# Patient Record
Sex: Female | Born: 1960
Health system: Southern US, Community
[De-identification: ages and names within clinical notes are randomized; demographics above are authoritative.]

## PROBLEM LIST (undated history)

## (undated) DIAGNOSIS — K589 Irritable bowel syndrome without diarrhea: Secondary | ICD-10-CM

## (undated) DIAGNOSIS — E039 Hypothyroidism, unspecified: Secondary | ICD-10-CM

## (undated) DIAGNOSIS — J45909 Unspecified asthma, uncomplicated: Secondary | ICD-10-CM

## (undated) DIAGNOSIS — K219 Gastro-esophageal reflux disease without esophagitis: Secondary | ICD-10-CM

## (undated) DIAGNOSIS — R51 Headache: Secondary | ICD-10-CM

## (undated) DIAGNOSIS — M109 Gout, unspecified: Secondary | ICD-10-CM

## (undated) HISTORY — DX: Gout, unspecified: M10.9

## (undated) HISTORY — DX: Headache: R51

## (undated) HISTORY — PX: NASAL SINUS SURGERY: SHX719

## (undated) HISTORY — PX: ABDOMINAL HYSTERECTOMY: SHX81

## (undated) HISTORY — PX: STRABISMUS SURGERY: SHX218

## (undated) HISTORY — PX: BUNIONECTOMY: SHX129

## (undated) HISTORY — DX: Hypothyroidism, unspecified: E03.9

## (undated) HISTORY — PX: TENDON REPAIR: SHX5111

## (undated) HISTORY — DX: Unspecified asthma, uncomplicated: J45.909

## (undated) HISTORY — DX: Gastro-esophageal reflux disease without esophagitis: K21.9

## (undated) HISTORY — DX: Irritable bowel syndrome, unspecified: K58.9

---
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
## 2003-09-11 DIAGNOSIS — J301 Allergic rhinitis due to pollen: Secondary | ICD-10-CM | POA: Insufficient documentation

## 2003-09-11 DIAGNOSIS — J45909 Unspecified asthma, uncomplicated: Secondary | ICD-10-CM | POA: Insufficient documentation

## 2003-10-15 DIAGNOSIS — E039 Hypothyroidism, unspecified: Secondary | ICD-10-CM | POA: Insufficient documentation

## 2005-04-20 DIAGNOSIS — M545 Low back pain, unspecified: Secondary | ICD-10-CM | POA: Insufficient documentation

## 2009-01-08 DIAGNOSIS — F419 Anxiety disorder, unspecified: Secondary | ICD-10-CM | POA: Insufficient documentation

## 2010-01-20 DIAGNOSIS — R42 Dizziness and giddiness: Secondary | ICD-10-CM | POA: Insufficient documentation

## 2011-10-06 ENCOUNTER — Ambulatory Visit: Payer: Self-pay | Admitting: Internal Medicine

## 2012-08-09 ENCOUNTER — Other Ambulatory Visit (HOSPITAL_COMMUNITY): Payer: Self-pay | Admitting: Obstetrics

## 2012-08-09 DIAGNOSIS — Z1231 Encounter for screening mammogram for malignant neoplasm of breast: Secondary | ICD-10-CM

## 2012-08-15 ENCOUNTER — Ambulatory Visit (HOSPITAL_COMMUNITY)
Admission: RE | Admit: 2012-08-15 | Discharge: 2012-08-15 | Disposition: A | Discharge status: Home / Self Care | Payer: No Typology Code available for payment source | Source: Ambulatory Visit | Attending: Obstetrics | Admitting: Obstetrics

## 2012-08-15 DIAGNOSIS — Z1231 Encounter for screening mammogram for malignant neoplasm of breast: Secondary | ICD-10-CM | POA: Insufficient documentation

## 2013-02-13 ENCOUNTER — Other Ambulatory Visit: Payer: Self-pay | Admitting: Family Medicine

## 2013-02-13 DIAGNOSIS — R51 Headache: Secondary | ICD-10-CM

## 2013-02-13 DIAGNOSIS — J329 Chronic sinusitis, unspecified: Secondary | ICD-10-CM

## 2013-02-27 ENCOUNTER — Ambulatory Visit
Admission: RE | Admit: 2013-02-27 | Discharge: 2013-02-27 | Disposition: A | Discharge status: Home / Self Care | Payer: No Typology Code available for payment source | Source: Ambulatory Visit | Attending: Family Medicine | Admitting: Family Medicine

## 2013-02-27 DIAGNOSIS — R51 Headache: Secondary | ICD-10-CM

## 2013-03-02 ENCOUNTER — Other Ambulatory Visit: Payer: No Typology Code available for payment source

## 2013-03-14 ENCOUNTER — Ambulatory Visit: Payer: No Typology Code available for payment source | Admitting: Neurology

## 2013-03-15 ENCOUNTER — Encounter: Payer: Self-pay | Admitting: Neurology

## 2013-03-15 ENCOUNTER — Ambulatory Visit (INDEPENDENT_AMBULATORY_CARE_PROVIDER_SITE_OTHER): Payer: No Typology Code available for payment source | Admitting: Neurology

## 2013-03-15 ENCOUNTER — Encounter (INDEPENDENT_AMBULATORY_CARE_PROVIDER_SITE_OTHER): Payer: Self-pay

## 2013-03-15 VITALS — BP 131/71 | HR 71 | Ht 62.0 in | Wt 144.0 lb

## 2013-03-15 DIAGNOSIS — R279 Unspecified lack of coordination: Secondary | ICD-10-CM

## 2013-03-15 DIAGNOSIS — R519 Headache, unspecified: Secondary | ICD-10-CM | POA: Insufficient documentation

## 2013-03-15 DIAGNOSIS — R51 Headache: Secondary | ICD-10-CM

## 2013-03-15 DIAGNOSIS — R270 Ataxia, unspecified: Secondary | ICD-10-CM

## 2013-03-15 HISTORY — DX: Headache: R51

## 2013-03-15 NOTE — Patient Instructions (Signed)

## 2013-03-15 NOTE — Progress Notes (Signed)
Reason for visit: Headache  Katie Oneill is an 53 y.o. female  History of present illness:  [name] is a 53 year old right-handed white female with a history of asthma who began having headaches in November 2014. The patient noted that the headache was on the top of the head, associated with a pressure feeling that would come and go throughout the day, but the headaches were everyday for almost 6 weeks. The patient also had cognitive processing and word finding problems, and clumsiness of the right hand. With walking, the patient will have trouble with veering to the left. The patient did not have any falls. The patient reported no numbness of the extremities or face. The patient had no visual complaints, and no problems with slurred speech or problems swallowing. The patient did not have any blackout episodes. In the past, the patient has had episodes of true vertigo unassociated with headache. The patient has occasional neck pain, without radiation down the arms. The patient denies any problems controlling the bowels or the bladder. One week prior to this evaluation, the headaches have gone away, and the patient now feels essentially normal. The patient underwent MRI evaluation of the brain that was done on 02/13/2013, and this study was reviewed on line. This study was normal. The patient is sent to this office for further evaluation.   Past Medical History  Diagnosis Date  . Hypothyroid   . Asthma   . YHCWCBJS(283.1) 03/15/2013    Past Surgical History  Procedure Laterality Date  . Nasal sinus surgery    . Strabismus surgery    . Bunionectomy Left   . Abdominal hysterectomy    . Tendon repair Right     hand    Family History  Problem Relation Age of Onset  . Heart disease Mother   . Heart disease Father   . Melanoma Father     Social history:  reports that she has never smoked. She has never used smokeless tobacco. She reports that she drinks alcohol. She reports that she  does not use illicit drugs.    Allergies  Allergen Reactions  . Penicillins Hives  . Sulfa Antibiotics Hives    Medications:  No current outpatient prescriptions on file prior to visit.   No current facility-administered medications on file prior to visit.    ROS:  Out of a complete 14 system review of symptoms, the patient complains only of the following symptoms, and all other reviewed systems are negative.  Weight gain, fatigue Feeling hot Allergies Headache, weakness, dizziness Anxiety  Blood pressure 131/71, pulse 71, height 5\' 2"  (1.575 m), weight 144 lb (65.318 kg).  Physical Exam  General: The patient is alert and cooperative at the time of the examination.  Eyes: Pupils are equal, round, and reactive to light. Discs are flat bilaterally.  Neck: The neck is supple, no carotid bruits are noted.  Respiratory: The respiratory examination is clear.  Cardiovascular: The cardiovascular examination reveals a regular rate and rhythm, no obvious murmurs or rubs are noted.  Skin: Extremities are without significant edema.  Neurologic Exam  Mental status: The patient is alert and oriented x 3 at the time of the examination. The patient has apparent normal recent and remote memory, with an apparently normal attention span and concentration ability.  Cranial nerves: Facial symmetry is present. There is good sensation of the face to pinprick and soft touch bilaterally. The strength of the facial muscles and the muscles to head turning and shoulder  shrug are normal bilaterally. Speech is well enunciated, no aphasia or dysarthria is noted. Extraocular movements are full. Visual fields are full. The tongue is midline, and the patient has symmetric elevation of the soft palate. No obvious hearing deficits are noted.  Motor: The motor testing reveals 5 over 5 strength of all 4 extremities. Good symmetric motor tone is noted throughout.  Sensory: Sensory testing is intact to  pinprick, soft touch, vibration sensation, and position sense on all 4 extremities. No evidence of extinction is noted.  Coordination: Cerebellar testing reveals good finger-nose-finger and heel-to-shin bilaterally.  Gait and station: Gait is normal. Tandem gait is normal. Romberg is negative. No drift is seen.  Reflexes: Deep tendon reflexes are symmetric and normal bilaterally. Toes are downgoing bilaterally.     MRI brain  02/13/2013:   IMPRESSION:  Normal noncontrast MRI of the brain for age. No specific findings to  explain dizziness.    Assessment/Plan:  One. Headache, gait disturbance, ataxia right arm  The patient at this point is back to her baseline. MRI of the brain was complely normal, but the patient had symptoms of headache, right arm clumsiness, and a gait disturbance that lasted almost 6 weeks. The patient may have migraine, but the patient will undergo further evaluation for cerebrovascular disease. The patient has no prior history of migraine. The patient will be set up for a carotid Doppler study, and a MRA of the head. The patient will undergo blood work today. If the headache and focal symptoms recur, the patient will be placed on medications for presumed migraine headache. Low-dose aspirin should be considered.   Jill Alexanders MD 03/15/2013 3:13 PM  [facility] [address] [place], [place] [address]  Phone [phone] Fax [phone]

## 2013-03-16 LAB — SEDIMENTATION RATE: Sed Rate: 3 mm/hr (ref 0–40)

## 2013-03-16 LAB — ANGIOTENSIN CONVERTING ENZYME: Angio Convert Enzyme: 47 U/L (ref 14–82)

## 2013-03-16 LAB — ANA W/REFLEX: ANA: NEGATIVE

## 2013-03-22 ENCOUNTER — Telehealth: Payer: Self-pay | Admitting: Neurology

## 2013-03-22 NOTE — Telephone Encounter (Signed)
Called patient and informed that order is still processing, awaiting approval from [employer]),lt Message

## 2013-03-22 NOTE — Telephone Encounter (Signed)
Patient needing to schedule MRI but has not heard back from anyone. Please call to advise.

## 2013-03-28 ENCOUNTER — Ambulatory Visit (INDEPENDENT_AMBULATORY_CARE_PROVIDER_SITE_OTHER): Payer: No Typology Code available for payment source

## 2013-03-28 DIAGNOSIS — R270 Ataxia, unspecified: Secondary | ICD-10-CM

## 2013-03-28 DIAGNOSIS — R279 Unspecified lack of coordination: Secondary | ICD-10-CM

## 2013-03-28 DIAGNOSIS — R51 Headache: Secondary | ICD-10-CM

## 2013-03-28 DIAGNOSIS — R269 Unspecified abnormalities of gait and mobility: Secondary | ICD-10-CM

## 2013-03-29 ENCOUNTER — Telehealth: Payer: Self-pay | Admitting: Neurology

## 2013-03-29 NOTE — Telephone Encounter (Signed)
Called patient and read Dr Tobey Grim message back to patient, she verbalized understanding

## 2013-03-29 NOTE — Telephone Encounter (Signed)
Patient could not understand Dr. Jannifer Franklin message concerning aspirin. Please call back to advise.

## 2013-03-29 NOTE — Telephone Encounter (Signed)
I called patient. The carotid Doppler study was unremarkable. MRA of the head showed some artifact in the left internal carotid artery, but no definite blockages. The patient should be on low-dose aspirin, the event she is having could represent migrainous episodes.   MRI angiogram of the head 03/29/2013:  Impression   Equivocal MRA head (without) demonstrating: 1. The left internal carotid artery has a focal area of decreased signal  as it enters the petrous ridge. There is normal appearing flow more  distally through the remainder of the internal carotid artery. Therefore  this likely represents artifact, and less likely atherosclerotic stenosis. 2. Remainder of medium-large sized vessels are unremarkable.

## 2013-06-18 ENCOUNTER — Inpatient Hospital Stay (HOSPITAL_COMMUNITY)
Admission: EM | Admit: 2013-06-18 | Discharge: 2013-06-21 | DRG: 419 | Disposition: A | Discharge status: Home / Self Care | Payer: No Typology Code available for payment source | Attending: Internal Medicine | Admitting: Internal Medicine

## 2013-06-18 ENCOUNTER — Encounter (HOSPITAL_COMMUNITY): Payer: Self-pay | Admitting: Emergency Medicine

## 2013-06-18 DIAGNOSIS — K8070 Calculus of gallbladder and bile duct without cholecystitis without obstruction: Principal | ICD-10-CM | POA: Diagnosis present

## 2013-06-18 DIAGNOSIS — J45909 Unspecified asthma, uncomplicated: Secondary | ICD-10-CM | POA: Diagnosis present

## 2013-06-18 DIAGNOSIS — R52 Pain, unspecified: Secondary | ICD-10-CM | POA: Diagnosis not present

## 2013-06-18 DIAGNOSIS — R51 Headache: Secondary | ICD-10-CM | POA: Diagnosis present

## 2013-06-18 DIAGNOSIS — Z808 Family history of malignant neoplasm of other organs or systems: Secondary | ICD-10-CM

## 2013-06-18 DIAGNOSIS — K8050 Calculus of bile duct without cholangitis or cholecystitis without obstruction: Secondary | ICD-10-CM | POA: Diagnosis present

## 2013-06-18 DIAGNOSIS — E039 Hypothyroidism, unspecified: Secondary | ICD-10-CM | POA: Diagnosis present

## 2013-06-18 DIAGNOSIS — Z880 Allergy status to penicillin: Secondary | ICD-10-CM

## 2013-06-18 DIAGNOSIS — Z8249 Family history of ischemic heart disease and other diseases of the circulatory system: Secondary | ICD-10-CM

## 2013-06-18 DIAGNOSIS — Z881 Allergy status to other antibiotic agents status: Secondary | ICD-10-CM

## 2013-06-18 DIAGNOSIS — R109 Unspecified abdominal pain: Secondary | ICD-10-CM | POA: Diagnosis not present

## 2013-06-18 DIAGNOSIS — D72829 Elevated white blood cell count, unspecified: Secondary | ICD-10-CM | POA: Diagnosis present

## 2013-06-18 DIAGNOSIS — Z79899 Other long term (current) drug therapy: Secondary | ICD-10-CM

## 2013-06-18 NOTE — ED Notes (Signed)
Pt reports she had sudden onset mid abdominal pain radiating to her back after eating and going to yoga. States that she has had mild pain that she takes Zantac before, but not this severe. Pt reports pain has decreased at this time. Denies n/v/d. LBM 4/27. Pt a&o x4, ambulatory to triage.

## 2013-06-19 ENCOUNTER — Inpatient Hospital Stay (HOSPITAL_COMMUNITY): Payer: No Typology Code available for payment source

## 2013-06-19 ENCOUNTER — Encounter (HOSPITAL_COMMUNITY): Payer: Self-pay | Admitting: *Deleted

## 2013-06-19 ENCOUNTER — Emergency Department (HOSPITAL_COMMUNITY): Payer: No Typology Code available for payment source

## 2013-06-19 ENCOUNTER — Encounter (HOSPITAL_COMMUNITY)
Admission: EM | Disposition: A | Discharge status: Home / Self Care | Payer: Self-pay | Source: Home / Self Care | Attending: Internal Medicine

## 2013-06-19 DIAGNOSIS — R109 Unspecified abdominal pain: Secondary | ICD-10-CM | POA: Diagnosis present

## 2013-06-19 DIAGNOSIS — K8050 Calculus of bile duct without cholangitis or cholecystitis without obstruction: Secondary | ICD-10-CM | POA: Diagnosis present

## 2013-06-19 HISTORY — PX: ERCP: SHX5425

## 2013-06-19 LAB — URINALYSIS, ROUTINE W REFLEX MICROSCOPIC
Bilirubin Urine: NEGATIVE
Glucose, UA: NEGATIVE mg/dL
Hgb urine dipstick: NEGATIVE
Ketones, ur: NEGATIVE mg/dL
Leukocytes, UA: NEGATIVE
NITRITE: NEGATIVE
PH: 8 (ref 5.0–8.0)
Protein, ur: NEGATIVE mg/dL
SPECIFIC GRAVITY, URINE: 1.004 — AB (ref 1.005–1.030)
Urobilinogen, UA: 0.2 mg/dL (ref 0.0–1.0)

## 2013-06-19 LAB — I-STAT TROPONIN, ED: Troponin i, poc: 0 ng/mL (ref 0.00–0.08)

## 2013-06-19 LAB — COMPREHENSIVE METABOLIC PANEL
ALBUMIN: 3.6 g/dL (ref 3.5–5.2)
ALK PHOS: 62 U/L (ref 39–117)
ALT: 20 U/L (ref 0–35)
AST: 23 U/L (ref 0–37)
BUN: 15 mg/dL (ref 6–23)
CO2: 26 mEq/L (ref 19–32)
CREATININE: 0.73 mg/dL (ref 0.50–1.10)
Calcium: 9.4 mg/dL (ref 8.4–10.5)
Chloride: 101 mEq/L (ref 96–112)
GFR calc Af Amer: >90 mL/min (ref >90)
GFR calc non Af Amer: >90 mL/min (ref >90)
Glucose, Bld: 116 mg/dL — ABNORMAL HIGH (ref 70–99)
POTASSIUM: 3.9 meq/L (ref 3.7–5.3)
Sodium: 139 mEq/L (ref 137–147)
TOTAL PROTEIN: 6.6 g/dL (ref 6.0–8.3)
Total Bilirubin: 0.2 mg/dL — ABNORMAL LOW (ref 0.3–1.2)

## 2013-06-19 LAB — LIPASE, BLOOD: LIPASE: 42 U/L (ref 11–59)

## 2013-06-19 LAB — CBC WITH DIFFERENTIAL/PLATELET
BASOS ABS: 0 10*3/uL (ref 0.0–0.1)
BASOS PCT: 0 % (ref 0–1)
EOS ABS: 0.1 10*3/uL (ref 0.0–0.7)
Eosinophils Relative: 1 % (ref 0–5)
HCT: 39.9 % (ref 36.0–46.0)
HEMOGLOBIN: 13.4 g/dL (ref 12.0–15.0)
Lymphocytes Relative: 21 % (ref 12–46)
Lymphs Abs: 2.4 10*3/uL (ref 0.7–4.0)
MCH: 28.9 pg (ref 26.0–34.0)
MCHC: 33.6 g/dL (ref 30.0–36.0)
MCV: 86 fL (ref 78.0–100.0)
MONOS PCT: 7 % (ref 3–12)
Monocytes Absolute: 0.8 10*3/uL (ref 0.1–1.0)
Neutro Abs: 8.1 10*3/uL — ABNORMAL HIGH (ref 1.7–7.7)
Neutrophils Relative %: 71 % (ref 43–77)
Platelets: 279 10*3/uL (ref 150–400)
RBC: 4.64 MIL/uL (ref 3.87–5.11)
RDW: 12.3 % (ref 11.5–15.5)
WBC: 11.4 10*3/uL — ABNORMAL HIGH (ref 4.0–10.5)

## 2013-06-19 SURGERY — ERCP, WITH INTERVENTION IF INDICATED
Anesthesia: Moderate Sedation

## 2013-06-19 MED ORDER — LEVALBUTEROL TARTRATE 45 MCG/ACT IN AERO: 2.0000 | INHALATION_SPRAY | RESPIRATORY_TRACT | Status: DC | PRN

## 2013-06-19 MED ORDER — METRONIDAZOLE IN NACL 5-0.79 MG/ML-% IV SOLN
500.0000 mg | Freq: Once | INTRAVENOUS | Status: DC
  Filled 2013-06-19: qty 100

## 2013-06-19 MED ORDER — FENTANYL CITRATE 0.05 MG/ML IJ SOLN: 50.0000 ug | INTRAMUSCULAR | Status: DC | PRN

## 2013-06-19 MED ORDER — FAMOTIDINE 20 MG PO TABS
20.0000 mg | ORAL_TABLET | Freq: Two times a day (BID) | ORAL | Status: DC
  Administered 2013-06-19 – 2013-06-21 (×3): 20 mg via ORAL
  Filled 2013-06-19 (×6): qty 1

## 2013-06-19 MED ORDER — OMEGA-3-ACID ETHYL ESTERS 1 G PO CAPS
1.0000 g | ORAL_CAPSULE | Freq: Every day | ORAL | Status: DC
  Filled 2013-06-19 (×3): qty 1

## 2013-06-19 MED ORDER — SODIUM CHLORIDE 0.9 % IV SOLN
INTRAVENOUS | Status: AC
  Administered 2013-06-19: 14:00:00 via INTRAVENOUS

## 2013-06-19 MED ORDER — PIPERACILLIN-TAZOBACTAM 3.375 G IVPB: 3.3750 g | Freq: Once | INTRAVENOUS | Status: DC

## 2013-06-19 MED ORDER — MIDAZOLAM HCL 10 MG/2ML IJ SOLN
INTRAMUSCULAR | Status: DC | PRN
  Administered 2013-06-19: 2 mg via INTRAVENOUS
  Administered 2013-06-19: 1 mg via INTRAVENOUS
  Administered 2013-06-19: 2 mg via INTRAVENOUS
  Administered 2013-06-19: 2.5 mg via INTRAVENOUS
  Administered 2013-06-19 (×2): 1 mg via INTRAVENOUS

## 2013-06-19 MED ORDER — LEVOTHYROXINE SODIUM 88 MCG PO TABS
88.0000 ug | ORAL_TABLET | Freq: Every day | ORAL | Status: DC
  Administered 2013-06-19: 88 ug via ORAL
  Filled 2013-06-19 (×4): qty 1

## 2013-06-19 MED ORDER — FLUTICASONE PROPIONATE 50 MCG/ACT NA SUSP
2.0000 | Freq: Every day | NASAL | Status: DC
  Filled 2013-06-19: qty 16

## 2013-06-19 MED ORDER — CIPROFLOXACIN IN D5W 400 MG/200ML IV SOLN
400.0000 mg | Freq: Two times a day (BID) | INTRAVENOUS | Status: DC
  Administered 2013-06-19 – 2013-06-20 (×3): 400 mg via INTRAVENOUS
  Filled 2013-06-19 (×3): qty 200

## 2013-06-19 MED ORDER — LORAZEPAM 0.5 MG PO TABS: 0.5000 mg | ORAL_TABLET | ORAL | Status: DC | PRN

## 2013-06-19 MED ORDER — FENTANYL CITRATE 0.05 MG/ML IJ SOLN
INTRAMUSCULAR | Status: AC
  Filled 2013-06-19: qty 4

## 2013-06-19 MED ORDER — DIPHENHYDRAMINE HCL 50 MG/ML IJ SOLN
INTRAMUSCULAR | Status: AC
  Filled 2013-06-19: qty 1

## 2013-06-19 MED ORDER — LORATADINE 10 MG PO TABS
10.0000 mg | ORAL_TABLET | Freq: Every day | ORAL | Status: DC
  Administered 2013-06-21: 10 mg via ORAL
  Filled 2013-06-19 (×3): qty 1

## 2013-06-19 MED ORDER — BUDESONIDE-FORMOTEROL FUMARATE 80-4.5 MCG/ACT IN AERO
2.0000 | INHALATION_SPRAY | Freq: Two times a day (BID) | RESPIRATORY_TRACT | Status: DC
  Administered 2013-06-19: 2 via RESPIRATORY_TRACT
  Filled 2013-06-19: qty 6.9

## 2013-06-19 MED ORDER — ONDANSETRON HCL 4 MG/2ML IJ SOLN: 4.0000 mg | Freq: Three times a day (TID) | INTRAMUSCULAR | Status: AC | PRN

## 2013-06-19 MED ORDER — SODIUM CHLORIDE 0.9 % IV SOLN
INTRAVENOUS | Status: DC
Start: 2013-06-19 — End: 2013-06-19
  Administered 2013-06-19: 500 mL via INTRAVENOUS

## 2013-06-19 MED ORDER — FENTANYL CITRATE 0.05 MG/ML IJ SOLN
INTRAMUSCULAR | Status: DC | PRN
  Administered 2013-06-19 (×4): 25 ug via INTRAVENOUS

## 2013-06-19 MED ORDER — GLUCAGON HCL (RDNA) 1 MG IJ SOLR
INTRAMUSCULAR | Status: AC
  Filled 2013-06-19: qty 2

## 2013-06-19 MED ORDER — DIPHENHYDRAMINE HCL 50 MG/ML IJ SOLN
INTRAMUSCULAR | Status: DC | PRN
  Administered 2013-06-19: 50 mg via INTRAVENOUS

## 2013-06-19 MED ORDER — ONDANSETRON HCL 4 MG PO TABS: 4.0000 mg | ORAL_TABLET | Freq: Four times a day (QID) | ORAL | Status: DC | PRN

## 2013-06-19 MED ORDER — METRONIDAZOLE IN NACL 5-0.79 MG/ML-% IV SOLN
500.0000 mg | Freq: Three times a day (TID) | INTRAVENOUS | Status: DC
  Administered 2013-06-19 – 2013-06-20 (×4): 500 mg via INTRAVENOUS
  Filled 2013-06-19 (×5): qty 100

## 2013-06-19 MED ORDER — FENTANYL CITRATE 0.05 MG/ML IJ SOLN
50.0000 ug | Freq: Once | INTRAMUSCULAR | Status: AC
  Administered 2013-06-19: 50 ug via INTRAVENOUS
  Filled 2013-06-19: qty 2

## 2013-06-19 MED ORDER — FENTANYL CITRATE 0.05 MG/ML IJ SOLN
50.0000 ug | INTRAMUSCULAR | Status: DC | PRN
  Administered 2013-06-19 – 2013-06-20 (×6): 50 ug via INTRAVENOUS
  Filled 2013-06-19 (×6): qty 2

## 2013-06-19 MED ORDER — ONDANSETRON HCL 4 MG/2ML IJ SOLN: 4.0000 mg | Freq: Four times a day (QID) | INTRAMUSCULAR | Status: DC | PRN

## 2013-06-19 MED ORDER — MIDAZOLAM HCL 10 MG/2ML IJ SOLN
INTRAMUSCULAR | Status: AC
  Filled 2013-06-19: qty 4

## 2013-06-19 MED ORDER — ALBUTEROL SULFATE (2.5 MG/3ML) 0.083% IN NEBU: 2.5000 mg | INHALATION_SOLUTION | Freq: Four times a day (QID) | RESPIRATORY_TRACT | Status: DC | PRN

## 2013-06-19 MED ORDER — SODIUM CHLORIDE 0.9 % IV SOLN
INTRAVENOUS | Status: DC | PRN
  Administered 2013-06-19: 18:00:00

## 2013-06-19 MED ORDER — CIPROFLOXACIN IN D5W 400 MG/200ML IV SOLN
400.0000 mg | Freq: Once | INTRAVENOUS | Status: AC
  Administered 2013-06-19: 400 mg via INTRAVENOUS
  Filled 2013-06-19: qty 200

## 2013-06-19 NOTE — ED Notes (Signed)
Dr. Devine at bedside

## 2013-06-19 NOTE — Op Note (Signed)
[facility] [place], [address]   ERCP PROCEDURE REPORT  PATIENT: Katie Oneill, Katie Oneill  MR# :[id number] BIRTHDATE: 1960-07-15  GENDER: Female ENDOSCOPIST: Carol Ada, MD REFERRED BY: PROCEDURE DATE:  06/19/2013 PROCEDURE:   ERCP with stone removal ASA CLASS:   Class II INDICATIONS: Choledocholithiasis MEDICATIONS: Versed 10 mg IV, Fentanyl 100 mcg IV, Cipro 400 mg IV, and Benadryl 50 mg IV TOPICAL ANESTHETIC: Cetacaine Spray  DESCRIPTION OF PROCEDURE:   After the risks benefits and alternatives of the procedure were thoroughly explained, informed consent was obtained.  The duodenoscope [id number]  endoscope was introduced through the mouth  and advanced to the second portion of the duodenum.  The ampulla was large and there was some friability at the distal tip.  ? spontaneous passage of the gallstone. Cannulation was moderately difficult with the floppy papilla. After several attempts the guidewire was secured in the PD.  A secondary wire was utilized and the easy access to the CBD was achieved.  The guidewire was secured in the left intrahepatic ducts.  Contrast injection revealed a CBD measuring 7-8 mm, but no evidence of any CBD or common hepatic duct stones.  A 1 cm sphincterotomy was created and then the CBD was swept four times. No stones were extracted.  Both the left and right common hepatic ducts were swept with the balloon.  No evidence of any cystic duct stones.     The scope was then completely withdrawn from the patient and the procedure terminated.     COMPLICATIONS:  ENDOSCOPIC IMPRESSION: 1) No further evidence of choledochoithiasis.  RECOMMENDATIONS: 1) Surgical consultation for cholecystectomy.    _______________________________ eSignedCarol Ada, MD 06/19/2013 6:06 PM   CC:

## 2013-06-19 NOTE — ED Provider Notes (Signed)
CSN: [id number]     Arrival date & time 06/18/13  2304 History   First MD Initiated Contact with Patient 06/19/13 0148     Chief Complaint  Patient presents with  . Abdominal Pain     (Consider location/radiation/quality/duration/timing/severity/associated sxs/prior Treatment) HPI Comments: Otherwise healthy 53 year old female presents with acute onset of periumbilical and right upper quadrant pain that radiated to the right shoulder. Initially this came on acutely, was severe and was associated with nausea, it has gradually eased off and now her symptoms are minimal. She denies any fevers chills coughing shortness of breath dysuria diarrhea rectal bleeding swelling or rashes. She has a history of hysterectomy but has never had any abdominal surgery or biliary colic. She reports having intermittent abdominal pain over several years which is rare and usually comes on after eating. She had a healthy egg white omelette earlier in the evening.  Patient is a 53 y.o. female presenting with abdominal pain. The history is provided by the patient and the spouse.  Abdominal Pain   Past Medical History  Diagnosis Date  . Hypothyroid   . Asthma   . NWGNFAOZ(308.6) 03/15/2013   Past Surgical History  Procedure Laterality Date  . Nasal sinus surgery      x2  . Strabismus surgery    . Bunionectomy Left   . Abdominal hysterectomy    . Tendon repair Right     hand   Family History  Problem Relation Age of Onset  . Heart disease Mother   . Heart disease Father   . Melanoma Father    History  Substance Use Topics  . Smoking status: Never Smoker   . Smokeless tobacco: Never Used  . Alcohol Use: Yes     Comment: rarely   OB History   Grav Para Term Preterm Abortions TAB SAB Ect Mult Living                 Review of Systems  Gastrointestinal: Positive for abdominal pain.  All other systems reviewed and are negative.     Allergies  Penicillins and Sulfa antibiotics  Home  Medications   Prior to Admission medications   Medication Sig Start Date End Date Taking? Authorizing Provider  Alum Hydroxide-Mag Carbonate (GAVISCON EXTRA STRENGTH PO) Take 1 tablet by mouth as needed (stomach issues).   Yes Historical Provider, MD  Calcium Carbonate-Vitamin D (CALCIUM + D PO) Take 1 tablet by mouth daily.   Yes Historical Provider, MD  Cyanocobalamin (VITAMIN B-12 PO) Take 1 capsule by mouth daily.   Yes Historical Provider, MD  fluticasone (FLONASE) 50 MCG/ACT nasal spray Place 2 sprays into both nostrils daily.  01/28/13  Yes Historical Provider, MD  levothyroxine (SYNTHROID, LEVOTHROID) 75 MCG tablet Take 75 mcg by mouth every other day. 02/23/13  Yes Historical Provider, MD  levothyroxine (SYNTHROID, LEVOTHROID) 88 MCG tablet Take 88 mcg by mouth daily before breakfast.   Yes Historical Provider, MD  loratadine (CLARITIN) 10 MG tablet Take 10 mg by mouth daily.   Yes Historical Provider, MD  LORazepam (ATIVAN) 0.5 MG tablet Take 0.5 mg by mouth as needed for anxiety (usually only uses for flight).   Yes Historical Provider, MD  Omega-3 Fatty Acids (FISH OIL PO) Take 1 capsule by mouth daily.   Yes Historical Provider, MD  ranitidine (ZANTAC) 150 MG tablet Take 150 mg by mouth as needed for heartburn (stomach pain).   Yes Historical Provider, MD  SYMBICORT 80-4.5 MCG/ACT inhaler Inhale 2 puffs into  the lungs 2 (two) times daily.  01/08/13  Yes Historical Provider, MD  XOPENEX HFA 45 MCG/ACT inhaler Inhale 2 puffs into the lungs as needed (SOB).  01/29/13  Yes Historical Provider, MD   BP 127/70  Pulse 61  Temp(Src) 98.1 F (36.7 C) (Oral)  Resp 16  Ht 5\' 2"  (1.575 m)  Wt 138 lb (62.596 kg)  BMI 25.23 kg/m2  SpO2 98% Physical Exam  Nursing note and vitals reviewed. Constitutional: She appears well-developed and well-nourished. No distress.  HENT:  Head: Normocephalic and atraumatic.  Mouth/Throat: Oropharynx is clear and moist. No oropharyngeal exudate.  Eyes:  Conjunctivae and EOM are normal. Pupils are equal, round, and reactive to light. Right eye exhibits no discharge. Left eye exhibits no discharge. No scleral icterus.  Neck: Normal range of motion. Neck supple. No JVD present. No thyromegaly present.  Cardiovascular: Normal rate, regular rhythm, normal heart sounds and intact distal pulses.  Exam reveals no gallop and no friction rub.   No murmur heard. Pulmonary/Chest: Effort normal and breath sounds normal. No respiratory distress. She has no wheezes. She has no rales.  Abdominal: Soft. Bowel sounds are normal. She exhibits no distension and no mass. There is tenderness ( Mild tenderness in the left upper quadrant and mid abdomen, no Murphy sign, no pain at McBurney's point).  Musculoskeletal: Normal range of motion. She exhibits no edema and no tenderness.  Lymphadenopathy:    She has no cervical adenopathy.  Neurological: She is alert. Coordination normal.  Skin: Skin is warm and dry. No rash noted. No erythema.  Psychiatric: She has a normal mood and affect. Her behavior is normal.    ED Course  Procedures (including critical care time) Labs Review Labs Reviewed  CBC WITH DIFFERENTIAL - Abnormal; Notable for the following:    WBC 11.4 (*)    Neutro Abs 8.1 (*)    All other components within normal limits  COMPREHENSIVE METABOLIC PANEL - Abnormal; Notable for the following:    Glucose, Bld 116 (*)    Total Bilirubin 0.2 (*)    All other components within normal limits  URINALYSIS, ROUTINE W REFLEX MICROSCOPIC - Abnormal; Notable for the following:    Specific Gravity, Urine 1.004 (*)    All other components within normal limits  LIPASE, BLOOD  I-STAT TROPOININ, ED    Imaging Review US Abdomen Complete  06/19/2013   CLINICAL DATA:  Right upper quadrant abdominal pain and leukocytosis.  EXAM: ULTRASOUND ABDOMEN COMPLETE  COMPARISON:  None.  FINDINGS: Gallbladder:  Two mobile gallstones are seen, measuring up to 5 mm in size. No  gallbladder wall thickening or pericholecystic fluid is seen. No ultrasonographic Murphy's sign is elicited.  Common bile duct:  Diameter: 0.6 cm, within normal limits in caliber. A single 0.5 cm stone is noted at the distal common hepatic duct; this corresponds to the patient's site of pain, without definite evidence of obstruction.  Liver:  A hyperechoic focus is noted at the left hepatic lobe, measuring 1.2 x 1.1 x 1.1 cm, likely reflecting a small hemangioma. Within normal limits in parenchymal echogenicity.  IVC:  No abnormality visualized.  Pancreas:  Visualized portion unremarkable.  Spleen:  Size and appearance within normal limits.  Right Kidney:  Length: 10.1 cm. Echogenicity within normal limits. No mass or hydronephrosis visualized.  Left Kidney:  Length: 10.8 cm. Echogenicity within normal limits. No mass or hydronephrosis visualized.  Abdominal aorta:  No aneurysm visualized.  Other findings:  None.  IMPRESSION: 1.  Choledocholithiasis. Single 0.5 cm stone noted at the distal common hepatic duct, corresponding to the patient's site of pain. The common hepatic duct remains normal in caliber, without definite evidence of obstruction. This may reflect an intermittently obstructing distal common hepatic duct stone. 2. Cholelithiasis; gallbladder otherwise unremarkable in appearance. 3. Likely small hemangioma at the left hepatic lobe.   Electronically Signed   By: Garald Balding M.D.   On: 06/19/2013 03:54      MDM   Final diagnoses:  Choledocholithiasis    The patient is well appearing with normal vital signs at this time, slight leukocytosis, no transaminitis and normal troponin, normal lipase.  Evaluated with ultrasound, patient declines medications at this time  Patient had recurrence of pain during ultrasound, ultrasound reveals no signs of cholecystitis but it does show choledocholithiasis with a stone in the common hepatic duct. This was discussed with both the hospitalist and the  gastroenterologist Dr. Collene Mares who recommends antibiotics and admission to the hospital, she will consult in the morning.  Patient given fentanyl for pain with significant improvement, she remained stable, abdominal exam is unchanged, temporary orders given.   Meds given in ED:  Medications  ciprofloxacin (CIPRO) IVPB 400 mg (not administered)  metroNIDAZOLE (FLAGYL) IVPB 500 mg (not administered)  fentaNYL (SUBLIMAZE) injection 50 mcg (50 mcg Intravenous Given 06/19/13 0436)       Katie Acosta, MD 06/19/13 [phone]

## 2013-06-19 NOTE — Progress Notes (Signed)
[facility] PROGRESS NOTE  Katie Oneill IEP:[id number] DOB: Jun 23, 1960 DOA: 06/18/2013 PCP: Tawanna Solo, MD  Assessment/Plan: Principal Problem:   Abdominal pain - Secondary to choledocholithiasis - Continue supportive therapy  Active Problems:   Choledocholithiasis - Status post ERCP - Continue supportive therapy   Code Status: Full  Disposition Plan: Per GI recommendation   Consultants:  GI  Procedures:  ERCP  Antibiotics: Cipro, Flagyl, metronidazole  HPI/Subjective: No acute issues reported to me  Objective: Filed Vitals:   06/19/13 1854  BP: 137/57  Pulse: 65  Temp: 98.7 F (37.1 C)  Resp: 16    Intake/Output Summary (Last 24 hours) at 06/19/13 2018 Last data filed at 06/19/13 2000  Gross per 24 hour  Intake    900 ml  Output   1700 ml  Net   -800 ml   Filed Weights   06/18/13 2321  Weight: 62.596 kg (138 lb)    Exam:   Vital signs reviewed in chart  Data Reviewed: Basic Metabolic Panel:  Recent Labs Lab 06/19/13 0118  NA 139  K 3.9  CL 101  CO2 26  GLUCOSE 116*  BUN 15  CREATININE 0.73  CALCIUM 9.4   Liver Function Tests:  Recent Labs Lab 06/19/13 0118  AST 23  ALT 20  ALKPHOS 62  BILITOT 0.2*  PROT 6.6  ALBUMIN 3.6    Recent Labs Lab 06/19/13 0118  LIPASE 42   No results found for this basename: AMMONIA,  in the last 168 hours CBC:  Recent Labs Lab 06/19/13 0118  WBC 11.4*  NEUTROABS 8.1*  HGB 13.4  HCT 39.9  MCV 86.0  PLT 279   Cardiac Enzymes: No results found for this basename: CKTOTAL, CKMB, CKMBINDEX, TROPONINI,  in the last 168 hours BNP (last 3 results) No results found for this basename: PROBNP,  in the last 8760 hours CBG: No results found for this basename: GLUCAP,  in the last 168 hours  No results found for this or any previous visit (from the past 240 hour(s)).   Studies: US Abdomen Complete  06/19/2013   CLINICAL DATA:  Right upper quadrant abdominal pain and  leukocytosis.  EXAM: ULTRASOUND ABDOMEN COMPLETE  COMPARISON:  None.  FINDINGS: Gallbladder:  Two mobile gallstones are seen, measuring up to 5 mm in size. No gallbladder wall thickening or pericholecystic fluid is seen. No ultrasonographic Murphy's sign is elicited.  Common bile duct:  Diameter: 0.6 cm, within normal limits in caliber. A single 0.5 cm stone is noted at the distal common hepatic duct; this corresponds to the patient's site of pain, without definite evidence of obstruction.  Liver:  A hyperechoic focus is noted at the left hepatic lobe, measuring 1.2 x 1.1 x 1.1 cm, likely reflecting a small hemangioma. Within normal limits in parenchymal echogenicity.  IVC:  No abnormality visualized.  Pancreas:  Visualized portion unremarkable.  Spleen:  Size and appearance within normal limits.  Right Kidney:  Length: 10.1 cm. Echogenicity within normal limits. No mass or hydronephrosis visualized.  Left Kidney:  Length: 10.8 cm. Echogenicity within normal limits. No mass or hydronephrosis visualized.  Abdominal aorta:  No aneurysm visualized.  Other findings:  None.  IMPRESSION: 1. Choledocholithiasis. Single 0.5 cm stone noted at the distal common hepatic duct, corresponding to the patient's site of pain. The common hepatic duct remains normal in caliber, without definite evidence of obstruction. This may reflect an intermittently obstructing distal common hepatic duct stone. 2. Cholelithiasis; gallbladder otherwise unremarkable in appearance. 3.  Likely small hemangioma at the left hepatic lobe.   Electronically Signed   By: Garald Balding M.D.   On: 06/19/2013 03:54   Dg Ercp Biliary & Pancreatic Ducts  06/19/2013   CLINICAL DATA:  Choledocholithiasis  EXAM: ERCP , sphincterotomy  TECHNIQUE: Multiple spot images obtained with the fluoroscopic device and submitted for interpretation post-procedure.  COMPARISON:  Abdominal ultrasound 06/19/2013  FINDINGS: A total of 9 intraoperative spot images were obtained  during the ERCP. The images demonstrate wire cannulation of the main pancreatic duct and common bile duct. Contrast opacification demonstrates a borderline dilated common bile duct. The cystic duct is patent. Contrast material partially fills the gallbladder. No intrahepatic biliary ductal dilatation. No definite choledocholithiasis on these images. The final images demonstrate balloon sweeping of the common duct.  IMPRESSION: ERCP and sphincterotomy as above.  The cystic duct is patent.  No definite choledocholithiasis.  These images were submitted for radiologic interpretation only. Please see the procedural report for the amount of contrast and the fluoroscopy time utilized.   Electronically Signed   By: Jacqulynn Cadet M.D.   On: 06/19/2013 18:35    Scheduled Meds: . budesonide-formoterol  2 puff Inhalation BID  . ciprofloxacin  400 mg Intravenous Q12H  . famotidine  20 mg Oral BID  . fluticasone  2 spray Each Nare Daily  . levothyroxine  88 mcg Oral QAC breakfast  . loratadine  10 mg Oral Daily  . metronidazole  500 mg Intravenous Once  . metronidazole  500 mg Intravenous Q8H  . omega-3 acid ethyl esters  1 g Oral Daily   Continuous Infusions:     Time spent: [facility] Pager [phone]. If 7PM-7AM, please contact night-coverage at [email or web address], password [email or web address] 06/19/2013, 8:18 PM  LOS: 1 day

## 2013-06-19 NOTE — Care Management Note (Signed)
    Page 1 of 1   06/19/2013     1:21:34 PM CARE MANAGEMENT NOTE 06/19/2013  Patient:  [facility]   Account Number:  [id number]  Date Initiated:  06/19/2013  Documentation initiated by:  Sunday Spillers  Subjective/Objective Assessment:   53 yo female admitted with abd pain, CBD stone. PTA lived at home with spouse.     Action/Plan:   Home when stable   Anticipated DC Date:  06/20/2013   Anticipated DC Plan:  [place]  CM consult      Choice offered to / List presented to:             Status of service:  Completed, signed off Medicare Important Message given?   (If response is "NO", the following Medicare IM given date fields will be blank) Date Medicare IM given:   Date Additional Medicare IM given:    Discharge Disposition:  HOME/SELF CARE  Per UR Regulation:  Reviewed for med. necessity/level of care/duration of stay  If discussed at [place] of Stay Meetings, dates discussed:    Comments:

## 2013-06-19 NOTE — ED Notes (Signed)
Pt's husband: Wyvonne Lenz: [phone] (cell) [phone] (home)

## 2013-06-19 NOTE — ED Notes (Signed)
Pt ambulating to restroom with steady gait

## 2013-06-19 NOTE — ED Notes (Signed)
Pt c.o central abdominal pain that radiated to her back and right shoulder. She took Zantac without relief. She does have her gall bladder. She reports that this pain has happen before but not to this severity and was relieved by Zantac.

## 2013-06-19 NOTE — ED Notes (Signed)
Pt aware of the need for urine sample  

## 2013-06-19 NOTE — H&P (Signed)
[facility] History and Physical  Katie Oneill PNT:[id number] DOB: 1960-04-21 DOA: 06/18/2013  Referring physician: ER physician PCP: Tawanna Solo, MD   Chief Complaint: abdominal pain  HPI:  53 year old female with past medical history of hypothyroidism, asthma who presented to [facility] ED 06/18/2013 with worsening right upper quadrant abdominal pain, 10 out of 10 in intensity, nonradiating and not relieved with rest. Pain started suddenly, she ate eggs this am and spinach and felt fine but then little later pain started and sharp, stabbing type of pain. She had some nausea but no vomiting. No fevers or chills. No previous reports of this similar pain. No reports of diarrhea or constipation. No blood in stool. In ED, vitals were stable and her blood work unremarkable except for slight leukocytosis of  11.4. Lipase and LFT's were WNL. Abd US showed choledocholithiasis, single 0.5 cm stone noted at the distal common hepatic duct, corresponding to the patient's site of pain; the common hepatic duct was normal in caliber, without definite evidence of obstruction. Dr. Sabra Heck in ED spoke with Dr. Collene Mares of GI who plans to do ERCP this am.  Assessment and Plan:  Principal Problem:  Abdominal pain, Choledocholithiasis - LFT's and lipase WNL - pain improved with fentanyl so will continue this medication for pain control  - Appreciate GI consult and recommendations. Plan for ERCP today by Dr. Collene Mares - Continue IV fluids, analgesia and antiemetics if needed  Active Problems: Hypothyroidism - continue synthroid 88 mcg daily   Radiological Exams on Admission: US Abdomen Complete 06/19/2013   IMPRESSION: 1. Choledocholithiasis. Single 0.5 cm stone noted at the distal common hepatic duct, corresponding to the patient's site of pain. The common hepatic duct remains normal in caliber, without definite evidence of obstruction. This may reflect an intermittently obstructing distal common hepatic duct stone.  2. Cholelithiasis; gallbladder otherwise unremarkable in appearance. 3. Likely small hemangioma at the left hepatic lobe.   Electronically Signed   By: Garald Balding M.D.   On: 06/19/2013 03:54    Code Status: Full Family Communication: Pt at bedside Disposition Plan: Admit for further evaluation  Robbie Lis, MD  [facility] Pager [phone]  Review of Systems:  Constitutional: Negative for fever, chills and malaise/fatigue. Negative for diaphoresis.  HENT: Negative for hearing loss, ear pain, nosebleeds, congestion, sore throat, neck pain, tinnitus and ear discharge.   Eyes: Negative for blurred vision, double vision, photophobia, pain, discharge and redness.  Respiratory: Negative for cough, hemoptysis, sputum production, shortness of breath, wheezing and stridor.   Cardiovascular: Negative for chest pain, palpitations, orthopnea, claudication and leg swelling.  Gastrointestinal: per HPI  Genitourinary: Negative for dysuria, urgency, frequency, hematuria and flank pain.  Musculoskeletal: Negative for myalgias, back pain, joint pain and falls.  Skin: Negative for itching and rash.  Neurological: Negative for dizziness and weakness. Negative for tingling, tremors, sensory change, speech change, focal weakness, loss of consciousness and headaches.  Endo/Heme/Allergies: Negative for environmental allergies and polydipsia. Does not bruise/bleed easily.  Psychiatric/Behavioral: Negative for suicidal ideas. The patient is not nervous/anxious.      Past Medical History  Diagnosis Date  . Hypothyroid   . Asthma   . [id number](245.8) 03/15/2013   Past Surgical History  Procedure Laterality Date  . Nasal sinus surgery      x2  . Strabismus surgery    . Bunionectomy Left   . Abdominal hysterectomy    . Tendon repair Right     hand   Social History:  reports that  she has never smoked. She has never used smokeless tobacco. She reports that she drinks alcohol. She reports that she  does not use illicit drugs.  Allergies  Allergen Reactions  . Penicillins Hives  . Sulfa Antibiotics Hives    Family History  Problem Relation Age of Onset  . Heart disease Mother   . Heart disease Father   . Melanoma Father      Prior to Admission medications   Medication Sig Start Date End Date Taking? Authorizing Provider  Alum Hydroxide-Mag Carbonate (GAVISCON EXTRA STRENGTH PO) Take 1 tablet by mouth as needed (stomach issues).   Yes Historical Provider, MD  Calcium Carbonate-Vitamin D (CALCIUM + D PO) Take 1 tablet by mouth daily.   Yes Historical Provider, MD  Cyanocobalamin (VITAMIN B-12 PO) Take 1 capsule by mouth daily.   Yes Historical Provider, MD  fluticasone (FLONASE) 50 MCG/ACT nasal spray Place 2 sprays into both nostrils daily.  01/28/13  Yes Historical Provider, MD  levothyroxine (SYNTHROID, LEVOTHROID) 75 MCG tablet Take 75 mcg by mouth every other day. 02/23/13  Yes Historical Provider, MD  levothyroxine (SYNTHROID, LEVOTHROID) 88 MCG tablet Take 88 mcg by mouth daily before breakfast.   Yes Historical Provider, MD  loratadine (CLARITIN) 10 MG tablet Take 10 mg by mouth daily.   Yes Historical Provider, MD  LORazepam (ATIVAN) 0.5 MG tablet Take 0.5 mg by mouth as needed for anxiety (usually only uses for flight).   Yes Historical Provider, MD  Omega-3 Fatty Acids (FISH OIL PO) Take 1 capsule by mouth daily.   Yes Historical Provider, MD  ranitidine (ZANTAC) 150 MG tablet Take 150 mg by mouth as needed for heartburn (stomach pain).   Yes Historical Provider, MD  SYMBICORT 80-4.5 MCG/ACT inhaler Inhale 2 puffs into the lungs 2 (two) times daily.  01/08/13  Yes Historical Provider, MD  XOPENEX HFA 45 MCG/ACT inhaler Inhale 2 puffs into the lungs as needed (SOB).  01/29/13  Yes Historical Provider, MD   Physical Exam: Filed Vitals:   06/18/13 2321 06/19/13 0102 06/19/13 0445  BP: 128/85 127/70   Pulse: 81 61 72  Temp: 98.1 F (36.7 C)    TempSrc: Oral    Resp: 18 16    Height: 5\' 2"  (1.575 m)    Weight: 62.596 kg (138 lb)    SpO2: 97% 98% 96%    Physical Exam  Constitutional: Appears well-developed and well-nourished. No distress.  HENT: Normocephalic. External right and left ear normal. Oropharynx is clear and moist.  Eyes: Conjunctivae and EOM are normal. PERRLA, no scleral icterus.  Neck: Normal ROM. Neck supple. No JVD. No tracheal deviation. No thyromegaly.  CVS: RRR, S1/S2 +, no murmurs, no gallops, no carotid bruit.  Pulmonary: Effort and breath sounds normal, no stridor, rhonchi, wheezes, rales.  Abdominal: Soft. BS +,  no distension, tenderness in right upper quadrant, no rebound or guarding.  Musculoskeletal: Normal range of motion. No edema and no tenderness.  Lymphadenopathy: No lymphadenopathy noted, cervical, inguinal. Neuro: Alert. Normal reflexes, muscle tone coordination. No cranial nerve deficit. Skin: Skin is warm and dry. No rash noted. Not diaphoretic. No erythema. No pallor.  Psychiatric: Normal mood and affect. Behavior, judgment, thought content normal.   Labs on Admission:  Basic Metabolic Panel:  Recent Labs Lab 06/19/13 0118  NA 139  K 3.9  CL 101  CO2 26  GLUCOSE 116*  BUN 15  CREATININE 0.73  CALCIUM 9.4   Liver Function Tests:  Recent Labs Lab 06/19/13  0118  AST 23  ALT 20  ALKPHOS 62  BILITOT 0.2*  PROT 6.6  ALBUMIN 3.6    Recent Labs Lab 06/19/13 0118  LIPASE 42   No results found for this basename: AMMONIA,  in the last 168 hours CBC:  Recent Labs Lab 06/19/13 0118  WBC 11.4*  NEUTROABS 8.1*  HGB 13.4  HCT 39.9  MCV 86.0  PLT 279   Cardiac Enzymes: No results found for this basename: CKTOTAL, CKMB, CKMBINDEX, TROPONINI,  in the last 168 hours BNP: No components found with this basename: POCBNP,  CBG: No results found for this basename: GLUCAP,  in the last 168 hours  If 7PM-7AM, please contact night-coverage [email or web address] Password [facility] 06/19/2013, 5:06 AM

## 2013-06-19 NOTE — Consult Note (Signed)
Reason for Consult: Choledocholithiasis Referring Physician: [facility]  [name] HPI: This is a 53 year old female who is admitted for choledocholithiasis.  She started to have acute abdominal pain when she ate eggs this morning.  Pin was unremitting and she presented to the ER for further evaluation.  Her liver panel was normal, but the RUQ U/S revealed that she had a 5 mm distal common hepatic duct stone.  Her CBD measured 6 mm.  Additionally, she was noted to have gallstones.  Past Medical History  Diagnosis Date  . Hypothyroid   . Asthma   . MGQQPYPP(509.3) 03/15/2013    Past Surgical History  Procedure Laterality Date  . Nasal sinus surgery      x2  . Strabismus surgery    . Bunionectomy Left   . Abdominal hysterectomy    . Tendon repair Right     hand    Family History  Problem Relation Age of Onset  . Heart disease Mother   . Heart disease Father   . Melanoma Father     Social History:  reports that she has never smoked. She has never used smokeless tobacco. She reports that she drinks alcohol. She reports that she does not use illicit drugs.  Allergies:  Allergies  Allergen Reactions  . Penicillins Hives  . Sulfa Antibiotics Hives    Medications:  Scheduled: . budesonide-formoterol  2 puff Inhalation BID  . ciprofloxacin  400 mg Intravenous Q12H  . famotidine  20 mg Oral BID  . fluticasone  2 spray Each Nare Daily  . levothyroxine  88 mcg Oral QAC breakfast  . loratadine  10 mg Oral Daily  . metronidazole  500 mg Intravenous Once  . metronidazole  500 mg Intravenous Q8H  . omega-3 acid ethyl esters  1 g Oral Daily   Continuous: . sodium chloride      Results for orders placed during the hospital encounter of 06/18/13 (from the past 24 hour(s))  CBC WITH DIFFERENTIAL     Status: Abnormal   Collection Time    06/19/13  1:18 AM      Result Value Ref Range   WBC 11.4 (*) 4.0 - 10.5 K/uL   RBC 4.64  3.87 - 5.11 MIL/uL   Hemoglobin 13.4   12.0 - 15.0 g/dL   HCT 39.9  36.0 - 46.0 %   MCV 86.0  78.0 - 100.0 fL   MCH 28.9  26.0 - 34.0 pg   MCHC 33.6  30.0 - 36.0 g/dL   RDW 12.3  11.5 - 15.5 %   Platelets 279  150 - 400 K/uL   Neutrophils Relative % 71  43 - 77 %   Neutro Abs 8.1 (*) 1.7 - 7.7 K/uL   Lymphocytes Relative 21  12 - 46 %   Lymphs Abs 2.4  0.7 - 4.0 K/uL   Monocytes Relative 7  3 - 12 %   Monocytes Absolute 0.8  0.1 - 1.0 K/uL   Eosinophils Relative 1  0 - 5 %   Eosinophils Absolute 0.1  0.0 - 0.7 K/uL   Basophils Relative 0  0 - 1 %   Basophils Absolute 0.0  0.0 - 0.1 K/uL  COMPREHENSIVE METABOLIC PANEL     Status: Abnormal   Collection Time    06/19/13  1:18 AM      Result Value Ref Range   Sodium 139  137 - 147 mEq/L   Potassium 3.9  3.7 - 5.3 mEq/L  Chloride 101  96 - 112 mEq/L   CO2 26  19 - 32 mEq/L   Glucose, Bld 116 (*) 70 - 99 mg/dL   BUN 15  6 - 23 mg/dL   Creatinine, Ser 0.73  0.50 - 1.10 mg/dL   Calcium 9.4  8.4 - 10.5 mg/dL   Total Protein 6.6  6.0 - 8.3 g/dL   Albumin 3.6  3.5 - 5.2 g/dL   AST 23  0 - 37 U/L   ALT 20  0 - 35 U/L   Alkaline Phosphatase 62  39 - 117 U/L   Total Bilirubin 0.2 (*) 0.3 - 1.2 mg/dL   GFR calc non Af Amer >90  >90 mL/min   GFR calc Af Amer >90  >90 mL/min  LIPASE, BLOOD     Status: None   Collection Time    06/19/13  1:18 AM      Result Value Ref Range   Lipase 42  11 - 59 U/L  I-STAT TROPOININ, ED     Status: None   Collection Time    06/19/13  1:41 AM      Result Value Ref Range   Troponin i, poc 0.00  0.00 - 0.08 ng/mL   Comment 3           URINALYSIS, ROUTINE W REFLEX MICROSCOPIC     Status: Abnormal   Collection Time    06/19/13  2:16 AM      Result Value Ref Range   Color, Urine YELLOW  YELLOW   APPearance CLEAR  CLEAR   Specific Gravity, Urine 1.004 (*) 1.005 - 1.030   pH 8.0  5.0 - 8.0   Glucose, UA NEGATIVE  NEGATIVE mg/dL   Hgb urine dipstick NEGATIVE  NEGATIVE   Bilirubin Urine NEGATIVE  NEGATIVE   Ketones, ur NEGATIVE  NEGATIVE  mg/dL   Protein, ur NEGATIVE  NEGATIVE mg/dL   Urobilinogen, UA 0.2  0.0 - 1.0 mg/dL   Nitrite NEGATIVE  NEGATIVE   Leukocytes, UA NEGATIVE  NEGATIVE     US Abdomen Complete  06/19/2013   CLINICAL DATA:  Right upper quadrant abdominal pain and leukocytosis.  EXAM: ULTRASOUND ABDOMEN COMPLETE  COMPARISON:  None.  FINDINGS: Gallbladder:  Two mobile gallstones are seen, measuring up to 5 mm in size. No gallbladder wall thickening or pericholecystic fluid is seen. No ultrasonographic Murphy's sign is elicited.  Common bile duct:  Diameter: 0.6 cm, within normal limits in caliber. A single 0.5 cm stone is noted at the distal common hepatic duct; this corresponds to the patient's site of pain, without definite evidence of obstruction.  Liver:  A hyperechoic focus is noted at the left hepatic lobe, measuring 1.2 x 1.1 x 1.1 cm, likely reflecting a small hemangioma. Within normal limits in parenchymal echogenicity.  IVC:  No abnormality visualized.  Pancreas:  Visualized portion unremarkable.  Spleen:  Size and appearance within normal limits.  Right Kidney:  Length: 10.1 cm. Echogenicity within normal limits. No mass or hydronephrosis visualized.  Left Kidney:  Length: 10.8 cm. Echogenicity within normal limits. No mass or hydronephrosis visualized.  Abdominal aorta:  No aneurysm visualized.  Other findings:  None.  IMPRESSION: 1. Choledocholithiasis. Single 0.5 cm stone noted at the distal common hepatic duct, corresponding to the patient's site of pain. The common hepatic duct remains normal in caliber, without definite evidence of obstruction. This may reflect an intermittently obstructing distal common hepatic duct stone. 2. Cholelithiasis; gallbladder otherwise unremarkable in appearance. 3.  Likely small hemangioma at the left hepatic lobe.   Electronically Signed   By: Garald Balding M.D.   On: 06/19/2013 03:54    ROS:  As stated above in the HPI otherwise negative.  Blood pressure 120/60, pulse 60,  temperature 98.3 F (36.8 C), temperature source Oral, resp. rate 18, height 5\' 2"  (1.575 m), weight 138 lb (62.596 kg), SpO2 98.00%.    PE: Gen: NAD, Alert and Oriented HEENT:  [place]/AT, EOMI Neck: Supple, no LAD Lungs: CTA Bilaterally CV: RRR without M/G/R ABM: Soft, NTND, +BS Ext: No C/C/E  Assessment/Plan: 1) Choledocholithiasis.   I discussed with her and her husband about the procedure in detail.  She wants to proceed with the procedure.  Plan: 1) ERCP.  Katie Oneill 06/19/2013, 11:30 AM

## 2013-06-20 ENCOUNTER — Inpatient Hospital Stay (HOSPITAL_COMMUNITY): Payer: No Typology Code available for payment source | Admitting: Anesthesiology

## 2013-06-20 ENCOUNTER — Encounter (HOSPITAL_COMMUNITY): Payer: No Typology Code available for payment source | Admitting: Anesthesiology

## 2013-06-20 ENCOUNTER — Inpatient Hospital Stay (HOSPITAL_COMMUNITY): Payer: No Typology Code available for payment source

## 2013-06-20 ENCOUNTER — Encounter (HOSPITAL_COMMUNITY)
Admission: EM | Disposition: A | Discharge status: Home / Self Care | Payer: Self-pay | Source: Home / Self Care | Attending: Internal Medicine

## 2013-06-20 ENCOUNTER — Encounter (HOSPITAL_COMMUNITY): Payer: Self-pay | Admitting: Gastroenterology

## 2013-06-20 DIAGNOSIS — K8010 Calculus of gallbladder with chronic cholecystitis without obstruction: Secondary | ICD-10-CM

## 2013-06-20 DIAGNOSIS — K8050 Calculus of bile duct without cholangitis or cholecystitis without obstruction: Secondary | ICD-10-CM

## 2013-06-20 DIAGNOSIS — K8070 Calculus of gallbladder and bile duct without cholecystitis without obstruction: Principal | ICD-10-CM

## 2013-06-20 DIAGNOSIS — R1011 Right upper quadrant pain: Secondary | ICD-10-CM

## 2013-06-20 HISTORY — PX: CHOLECYSTECTOMY: SHX55

## 2013-06-20 LAB — COMPREHENSIVE METABOLIC PANEL
ALT: 14 U/L (ref 0–35)
AST: 12 U/L (ref 0–37)
Albumin: 2.9 g/dL — ABNORMAL LOW (ref 3.5–5.2)
Alkaline Phosphatase: 53 U/L (ref 39–117)
BUN: 7 mg/dL (ref 6–23)
CO2: 24 meq/L (ref 19–32)
CREATININE: 0.8 mg/dL (ref 0.50–1.10)
Calcium: 8.7 mg/dL (ref 8.4–10.5)
Chloride: 105 mEq/L (ref 96–112)
GFR calc Af Amer: >90 mL/min (ref >90)
GFR, EST NON AFRICAN AMERICAN: 84 mL/min — AB (ref >90)
GLUCOSE: 98 mg/dL (ref 70–99)
Potassium: 3.7 mEq/L (ref 3.7–5.3)
SODIUM: 139 meq/L (ref 137–147)
Total Bilirubin: 0.4 mg/dL (ref 0.3–1.2)
Total Protein: 5.5 g/dL — ABNORMAL LOW (ref 6.0–8.3)

## 2013-06-20 LAB — CBC
HCT: 38 % (ref 36.0–46.0)
Hemoglobin: 12.7 g/dL (ref 12.0–15.0)
MCH: 28.5 pg (ref 26.0–34.0)
MCHC: 33.4 g/dL (ref 30.0–36.0)
MCV: 85.4 fL (ref 78.0–100.0)
Platelets: 275 10*3/uL (ref 150–400)
RBC: 4.45 MIL/uL (ref 3.87–5.11)
RDW: 12.5 % (ref 11.5–15.5)
WBC: 7.1 10*3/uL (ref 4.0–10.5)

## 2013-06-20 LAB — SURGICAL PCR SCREEN
MRSA, PCR: NEGATIVE
Staphylococcus aureus: NEGATIVE

## 2013-06-20 SURGERY — LAPAROSCOPIC CHOLECYSTECTOMY WITH INTRAOPERATIVE CHOLANGIOGRAM
Anesthesia: General | Site: Abdomen

## 2013-06-20 MED ORDER — LACTATED RINGERS IV SOLN
INTRAVENOUS | Status: DC | PRN
  Administered 2013-06-20: 14:00:00 via INTRAVENOUS

## 2013-06-20 MED ORDER — METOCLOPRAMIDE HCL 5 MG/ML IJ SOLN
INTRAMUSCULAR | Status: DC | PRN
  Administered 2013-06-20: 10 mg via INTRAVENOUS

## 2013-06-20 MED ORDER — ONDANSETRON HCL 4 MG PO TABS: 4.0000 mg | ORAL_TABLET | Freq: Four times a day (QID) | ORAL | Status: DC | PRN

## 2013-06-20 MED ORDER — ACETAMINOPHEN 325 MG PO TABS: 650.0000 mg | ORAL_TABLET | ORAL | Status: DC | PRN

## 2013-06-20 MED ORDER — PROPOFOL 10 MG/ML IV BOLUS
INTRAVENOUS | Status: DC | PRN
  Administered 2013-06-20: 150 mg via INTRAVENOUS

## 2013-06-20 MED ORDER — ROCURONIUM BROMIDE 100 MG/10ML IV SOLN
INTRAVENOUS | Status: DC | PRN
  Administered 2013-06-20: 30 mg via INTRAVENOUS
  Administered 2013-06-20: 5 mg via INTRAVENOUS

## 2013-06-20 MED ORDER — GLYCOPYRROLATE 0.2 MG/ML IJ SOLN
INTRAMUSCULAR | Status: DC | PRN
  Administered 2013-06-20: 0.6 mg via INTRAVENOUS

## 2013-06-20 MED ORDER — PROMETHAZINE HCL 25 MG/ML IJ SOLN
INTRAMUSCULAR | Status: AC
  Filled 2013-06-20: qty 1

## 2013-06-20 MED ORDER — IOHEXOL 300 MG/ML  SOLN
INTRAMUSCULAR | Status: DC | PRN
  Administered 2013-06-20: 4.5 mL

## 2013-06-20 MED ORDER — ONDANSETRON HCL 4 MG/2ML IJ SOLN
INTRAMUSCULAR | Status: AC
  Filled 2013-06-20: qty 2

## 2013-06-20 MED ORDER — ONDANSETRON HCL 4 MG/2ML IJ SOLN
INTRAMUSCULAR | Status: DC | PRN
  Administered 2013-06-20: 4 mg via INTRAVENOUS

## 2013-06-20 MED ORDER — KETOROLAC TROMETHAMINE 30 MG/ML IJ SOLN
30.0000 mg | Freq: Once | INTRAMUSCULAR | Status: AC
  Administered 2013-06-20: 30 mg via INTRAVENOUS
  Filled 2013-06-20: qty 1
  Filled 2013-06-20: qty 2

## 2013-06-20 MED ORDER — PROMETHAZINE HCL 25 MG/ML IJ SOLN
6.2500 mg | INTRAMUSCULAR | Status: DC | PRN
  Administered 2013-06-20: 6.25 mg via INTRAVENOUS

## 2013-06-20 MED ORDER — HYDROMORPHONE HCL PF 1 MG/ML IJ SOLN
INTRAMUSCULAR | Status: AC
  Filled 2013-06-20: qty 1

## 2013-06-20 MED ORDER — FENTANYL CITRATE 0.05 MG/ML IJ SOLN
INTRAMUSCULAR | Status: DC | PRN
  Administered 2013-06-20: 100 ug via INTRAVENOUS
  Administered 2013-06-20 (×3): 50 ug via INTRAVENOUS

## 2013-06-20 MED ORDER — HYDROMORPHONE HCL PF 1 MG/ML IJ SOLN
1.0000 mg | INTRAMUSCULAR | Status: DC | PRN
  Administered 2013-06-20: 1 mg via INTRAVENOUS
  Filled 2013-06-20: qty 1

## 2013-06-20 MED ORDER — MEPERIDINE HCL 50 MG/ML IJ SOLN
INTRAMUSCULAR | Status: AC
  Filled 2013-06-20: qty 1

## 2013-06-20 MED ORDER — HYDROMORPHONE HCL PF 1 MG/ML IJ SOLN
0.2500 mg | INTRAMUSCULAR | Status: DC | PRN
  Administered 2013-06-20 (×4): 0.5 mg via INTRAVENOUS

## 2013-06-20 MED ORDER — 0.9 % SODIUM CHLORIDE (POUR BTL) OPTIME
TOPICAL | Status: DC | PRN
  Administered 2013-06-20: 1000 mL

## 2013-06-20 MED ORDER — PROPOFOL 10 MG/ML IV BOLUS
INTRAVENOUS | Status: AC
  Filled 2013-06-20: qty 20

## 2013-06-20 MED ORDER — OXYCODONE HCL 5 MG PO TABS: 5.0000 mg | ORAL_TABLET | Freq: Once | ORAL | Status: DC | PRN

## 2013-06-20 MED ORDER — OXYCODONE HCL 5 MG/5ML PO SOLN: 5.0000 mg | Freq: Once | ORAL | Status: DC | PRN

## 2013-06-20 MED ORDER — ROCURONIUM BROMIDE 100 MG/10ML IV SOLN
INTRAVENOUS | Status: AC
  Filled 2013-06-20: qty 1

## 2013-06-20 MED ORDER — SUCCINYLCHOLINE CHLORIDE 20 MG/ML IJ SOLN
INTRAMUSCULAR | Status: DC | PRN
  Administered 2013-06-20: 100 mg via INTRAVENOUS

## 2013-06-20 MED ORDER — HYDROCODONE-ACETAMINOPHEN 5-325 MG PO TABS
1.0000 | ORAL_TABLET | ORAL | Status: DC | PRN
  Administered 2013-06-20 – 2013-06-21 (×3): 1 via ORAL
  Filled 2013-06-20 (×3): qty 1

## 2013-06-20 MED ORDER — DEXAMETHASONE SODIUM PHOSPHATE 10 MG/ML IJ SOLN
INTRAMUSCULAR | Status: AC
  Filled 2013-06-20: qty 1

## 2013-06-20 MED ORDER — MIDAZOLAM HCL 2 MG/2ML IJ SOLN
1.0000 mg | Freq: Once | INTRAMUSCULAR | Status: AC
  Administered 2013-06-20: 1 mg via INTRAVENOUS
  Filled 2013-06-20: qty 2

## 2013-06-20 MED ORDER — NEOSTIGMINE METHYLSULFATE 1 MG/ML IJ SOLN
INTRAMUSCULAR | Status: DC | PRN
  Administered 2013-06-20: 5 mg via INTRAVENOUS

## 2013-06-20 MED ORDER — LACTATED RINGERS IV SOLN
INTRAVENOUS | Status: DC | PRN
  Administered 2013-06-20: 1000 mL

## 2013-06-20 MED ORDER — ONDANSETRON HCL 4 MG/2ML IJ SOLN: 4.0000 mg | Freq: Four times a day (QID) | INTRAMUSCULAR | Status: DC | PRN

## 2013-06-20 MED ORDER — HYDROMORPHONE HCL PF 1 MG/ML IJ SOLN: 0.5000 mg | INTRAMUSCULAR | Status: DC | PRN

## 2013-06-20 MED ORDER — GLYCOPYRROLATE 0.2 MG/ML IJ SOLN
INTRAMUSCULAR | Status: AC
  Filled 2013-06-20: qty 3

## 2013-06-20 MED ORDER — DEXAMETHASONE SODIUM PHOSPHATE 10 MG/ML IJ SOLN
INTRAMUSCULAR | Status: DC | PRN
  Administered 2013-06-20: 10 mg via INTRAVENOUS

## 2013-06-20 MED ORDER — MIDAZOLAM HCL 2 MG/2ML IJ SOLN
INTRAMUSCULAR | Status: AC
  Filled 2013-06-20: qty 2

## 2013-06-20 MED ORDER — LORAZEPAM 2 MG/ML IJ SOLN: 0.5000 mg | Freq: Four times a day (QID) | INTRAMUSCULAR | Status: DC | PRN

## 2013-06-20 MED ORDER — LIDOCAINE HCL (CARDIAC) 20 MG/ML IV SOLN
INTRAVENOUS | Status: DC | PRN
  Administered 2013-06-20: 100 mg via INTRAVENOUS

## 2013-06-20 MED ORDER — BUPIVACAINE-EPINEPHRINE 0.5% -1:200000 IJ SOLN
INTRAMUSCULAR | Status: DC | PRN
  Administered 2013-06-20: 20 mL

## 2013-06-20 MED ORDER — BUPIVACAINE-EPINEPHRINE 0.5% -1:200000 IJ SOLN
INTRAMUSCULAR | Status: AC
  Filled 2013-06-20: qty 1

## 2013-06-20 MED ORDER — METOCLOPRAMIDE HCL 5 MG/ML IJ SOLN
INTRAMUSCULAR | Status: AC
  Filled 2013-06-20: qty 2

## 2013-06-20 MED ORDER — KCL IN DEXTROSE-NACL 20-5-0.45 MEQ/L-%-% IV SOLN
INTRAVENOUS | Status: DC
  Filled 2013-06-20 (×2): qty 1000

## 2013-06-20 MED ORDER — MEPERIDINE HCL 50 MG/ML IJ SOLN
6.2500 mg | INTRAMUSCULAR | Status: DC | PRN
  Administered 2013-06-20: 12.5 mg via INTRAVENOUS

## 2013-06-20 MED ORDER — FENTANYL CITRATE 0.05 MG/ML IJ SOLN
INTRAMUSCULAR | Status: AC
  Filled 2013-06-20: qty 5

## 2013-06-20 MED ORDER — MIDAZOLAM HCL 5 MG/5ML IJ SOLN
INTRAMUSCULAR | Status: DC | PRN
  Administered 2013-06-20: 2 mg via INTRAVENOUS

## 2013-06-20 SURGICAL SUPPLY — 33 items
APPLIER CLIP ROT 10 11.4 M/L (STAPLE) ×2
BENZOIN TINCTURE PRP APPL 2/3 (GAUZE/BANDAGES/DRESSINGS) ×2 IMPLANT
CABLE HIGH FREQUENCY MONO STRZ (ELECTRODE) ×2 IMPLANT
CANISTER SUCTION 2500CC (MISCELLANEOUS) IMPLANT
CHLORAPREP W/TINT 26ML (MISCELLANEOUS) ×2 IMPLANT
CLIP APPLIE ROT 10 11.4 M/L (STAPLE) ×1 IMPLANT
COVER MAYO STAND STRL (DRAPES) ×2 IMPLANT
DECANTER SPIKE VIAL GLASS SM (MISCELLANEOUS) ×2 IMPLANT
DRAPE C-ARM 42X120 X-RAY (DRAPES) ×2 IMPLANT
DRAPE LAPAROSCOPIC ABDOMINAL (DRAPES) ×2 IMPLANT
DRAPE UTILITY XL STRL (DRAPES) ×2 IMPLANT
ELECT REM PT RETURN 9FT ADLT (ELECTROSURGICAL) ×2
ELECTRODE REM PT RTRN 9FT ADLT (ELECTROSURGICAL) ×1 IMPLANT
GLOVE SURG ORTHO 8.0 STRL STRW (GLOVE) ×2 IMPLANT
GOWN STRL REUS W/TWL XL LVL3 (GOWN DISPOSABLE) ×4 IMPLANT
HEMOSTAT SURGICEL 4X8 (HEMOSTASIS) IMPLANT
KIT BASIN OR (CUSTOM PROCEDURE TRAY) ×2 IMPLANT
NS IRRIG 1000ML POUR BTL (IV SOLUTION) ×2 IMPLANT
POUCH SPECIMEN RETRIEVAL 10MM (ENDOMECHANICALS) ×2 IMPLANT
SCISSORS LAP 5X35 DISP (ENDOMECHANICALS) ×2 IMPLANT
SET CHOLANGIOGRAPH MIX (MISCELLANEOUS) ×2 IMPLANT
SET IRRIG TUBING LAPAROSCOPIC (IRRIGATION / IRRIGATOR) ×2 IMPLANT
SLEEVE XCEL OPT CAN 5 100 (ENDOMECHANICALS) ×2 IMPLANT
SOLUTION ANTI FOG 6CC (MISCELLANEOUS) ×2 IMPLANT
STRIP CLOSURE SKIN 1/2X4 (GAUZE/BANDAGES/DRESSINGS) ×2 IMPLANT
SUT MNCRL AB 4-0 PS2 18 (SUTURE) ×2 IMPLANT
TOWEL OR 17X26 10 PK STRL BLUE (TOWEL DISPOSABLE) ×2 IMPLANT
TOWEL OR NON WOVEN STRL DISP B (DISPOSABLE) ×2 IMPLANT
TRAY LAP CHOLE (CUSTOM PROCEDURE TRAY) ×2 IMPLANT
TROCAR BLADELESS OPT 5 100 (ENDOMECHANICALS) ×2 IMPLANT
TROCAR XCEL BLUNT TIP 100MML (ENDOMECHANICALS) ×2 IMPLANT
TROCAR XCEL NON-BLD 11X100MML (ENDOMECHANICALS) ×2 IMPLANT
TUBING INSUFFLATION 10FT LAP (TUBING) ×2 IMPLANT

## 2013-06-20 NOTE — Transfer of Care (Signed)
Immediate Anesthesia Transfer of Care Note  Patient: Katie Oneill  Procedure(s) Performed: Procedure(s) (LRB): LAPAROSCOPIC CHOLECYSTECTOMY WITH INTRAOPERATIVE CHOLANGIOGRAM (N/A)  Patient Location: PACU  Anesthesia Type: General  Level of Consciousness: sedated, patient cooperative and responds to stimulation  Airway & Oxygen Therapy: Patient Spontanous Breathing and Patient connected to face mask oxgen  Post-op Assessment: Report given to PACU RN and Post -op Vital signs reviewed and stable  Post vital signs: Reviewed and stable  Complications: No apparent anesthesia complications

## 2013-06-20 NOTE — Progress Notes (Signed)
Patient ID: Katie Oneill, female   DOB: 21-Apr-1960, 53 y.o.   MRN: [id number]  [facility] PROGRESS NOTE  Katie Oneill GYJ:[id number] DOB: September 07, 1960 DOA: 06/18/2013 PCP: Katie Solo, MD  Brief narrative: 53 year old female with past medical history of hypothyroidism, asthma who presented to [facility] ED 06/18/2013 with worsening right upper quadrant abdominal pain, 10 out of 10 in intensity, nonradiating and not relieved with rest. Pain started suddenly, she ate eggs and spinach and felt fine but then little later pain started, sharp, stabbing type of pain. She had some nausea but no vomiting. No fevers or chills. Pt reports similar events in the past but not as intense.   In ED, vitals were stable and her blood work unremarkable except for slight leukocytosis of 11.4. Lipase and LFT's were WNL. Abd US showed choledocholithiasis, single 0.5 cm stone noted at the distal common hepatic duct, corresponding to the patient's site of pain; the common hepatic duct was normal in caliber, without definite evidence of obstruction. Dr. Sabra Oneill in ED spoke with Dr. Collene Oneill of GI who plans to do ERCP this am.   Assessment and Plan:  Principal Problem:  Abdominal pain, Choledocholithiasis  - LFT's and lipase WNL  - s/p ERCP, tolerated well but with persistent discomfort  - surgery consulted for consideration of lap chole - Continue IV fluids, analgesia and antiemetics if needed  Active Problems:  Hypothyroidism  - continue synthroid 88 mcg daily   Consultants:  Surgery   GI  Procedures/Studies: US Abdomen Complete  06/19/2013   Choledocholithiasis. Single 0.5 cm stone noted at the distal common hepatic duct, corresponding to the patient's site of pain. The common hepatic duct remains normal in caliber, without definite evidence of obstruction. This may reflect an intermittently obstructing distal common hepatic duct stone. Cholelithiasis; gallbladder otherwise unremarkable in appearance. 3. Likely small  hemangioma at the left hepatic lobe.      Dg Ercp Biliary & Pancreatic Ducts  06/19/2013    ERCP and sphincterotomy as above.  The cystic duct is patent.  No definite choledocholithiasis.  These images were submitted for radiologic interpretation only. Please see the procedural report for the amount of contrast and the fluoroscopy time utilized.     Antibiotics:  Cipro 4/28 -->  Flagyl 4/29 -->  Code Status: Full Family Communication: Pt and husband at bedside Disposition Plan: Home when medically stable  HPI/Subjective: No events overnight.   Objective: Filed Vitals:   06/19/13 2230 06/20/13 0204 06/20/13 0433 06/20/13 0545  BP: 107/66 106/74  119/79  Pulse: 74 66  67  Temp: 98.2 F (36.8 C) 98.1 F (36.7 C)  98.4 F (36.9 C)  TempSrc: Oral Oral  Oral  Resp: 16 16  16   Height:      Weight:   66.3 kg (146 lb 2.6 oz)   SpO2: 98% 98%  97%    Intake/Output Summary (Last 24 hours) at 06/20/13 2637 Last data filed at 06/20/13 0559  Gross per 24 hour  Intake   1980 ml  Output   2400 ml  Net   -420 ml    Exam:   General:  Pt is alert, follows commands appropriately, not in acute distress  Respiratory: Clear to auscultation bilaterally, no wheezing, no crackles, no rhonchi  Abdomen: Soft, tender in RUQ area, non distended, no guarding  Neuro: Grossly nonfocal  Data Reviewed: Basic Metabolic Panel:  Recent Labs Lab 06/19/13 0118 06/20/13 0358  NA 139 139  K 3.9 3.7  CL 101 105  CO2 26 24  GLUCOSE 116* 98  BUN 15 7  CREATININE 0.73 0.80  CALCIUM 9.4 8.7   Liver Function Tests:  Recent Labs Lab 06/19/13 0118 06/20/13 0358  AST 23 12  ALT 20 14  ALKPHOS 62 53  BILITOT 0.2* 0.4  PROT 6.6 5.5*  ALBUMIN 3.6 2.9*    Recent Labs Lab 06/19/13 0118  LIPASE 42   CBC:  Recent Labs Lab 06/19/13 0118 06/20/13 0358  WBC 11.4* 7.1  NEUTROABS 8.1*  --   HGB 13.4 12.7  HCT 39.9 38.0  MCV 86.0 85.4  PLT 279 275     Scheduled Meds: .  budesonide-formoterol  2 puff Inhalation BID  . ciprofloxacin  400 mg Intravenous Q12H  . famotidine  20 mg Oral BID  . fluticasone  2 spray Each Nare Daily  . levothyroxine  88 mcg Oral QAC breakfast  . loratadine  10 mg Oral Daily  . metronidazole  500 mg Intravenous Once  . metronidazole  500 mg Intravenous Q8H  . omega-3 acid ethyl esters  1 g Oral Daily   Continuous Infusions:    Katie Blaze, MD  Katie Oneill Pager [phone]  If 7PM-7AM, please contact night-coverage [email or web address] Password [email or web address] 06/20/2013, 9:07 AM   LOS: 2 days

## 2013-06-20 NOTE — Op Note (Signed)
Procedure Note  Pre-operative Diagnosis:  Choledocholithiasis, cholelithiasis  Post-operative Diagnosis:  same  Surgeon:  Earnstine Regal, MD, FACS  Assistant:  none   Procedure:  Laparoscopic cholecystectomy with intra-operative cholangiography  Anesthesia:  General  Estimated Blood Loss:  minimal  Drains: none         Specimen: Gallbladder to pathology  Indications:  Patient is a 53 yo WF with abdominal pain and choledocholithiasis and cholelithiasis by ultrasound.  ERCP yesterday.  Now for lap chole with IOC.  Procedure Details:  The patient was seen in the pre-op holding area. The risks, benefits, complications, treatment options, and expected outcomes were previously discussed with the patient. The patient agreed with the proposed plan and has signed the informed consent form.  The patient was brought to the Operating Room, identified as Katie Oneill and the procedure verified as laparoscopic cholecystectomy with intraoperative cholangiography. A "time out" was completed and the above information confirmed.  The patient was placed in the supine position. Following induction of general anesthesia, the abdomen was prepped and draped in the usual aseptic fashion.  An incision was made in the skin near the umbilicus. The midline fascia was incised and the peritoneal cavity was entered and a Hasson canula was introduced under direct vision.  The Hasson canula was secured with a 0-Vicryl pursestring suture. Pneumoperitoneum was established with carbon dioxide. Additional trocars were introduced under direct vision along the right costal margin in the midline, mid-clavicular line, and anterior axillary line.   The gallbladder was identified and the fundus grasped and retracted cephalad. Adhesions were taken down bluntly and the electrocautery was utilized as needed, taking care not to injure any adjacent structures. The infundibulum was grasped and retracted laterally, exposing the  peritoneum overlying the triangle of Calot. The peritoneum was incised and structures exposed with blunt dissection. The cystic duct was clearly identified, bluntly dissected circumferentially, and clipped at the neck of the gallbladder.  An incision was made in the cystic duct and the cholangiogram catheter introduced. The catheter was secured using an ligaclip.  Real-time cholangiography was performed using C-arm fluoroscopy.  There was rapid filling of a normal caliber common bile duct.  There was reflux of contrast into the left and right hepatic ductal systems.  There was free flow distally into the duodenum without filling defect or obstruction.  The catheter was removed from the peritoneal cavity.  The cystic duct was then ligated with surgical clips and divided. The cystic artery was identified, dissected circumferentially, ligated with ligaclips, and divided.  The gallbladder was dissected away from the liver bed using the electrocautery for hemostasis. The gallbladder was completely removed from the liver and placed into an endocatch bag. The right upper quadrant was irrigated and the gallbladder bed was inspected. Hemostasis was achieved with the electrocautery.  Pneumoperitoneum was released after viewing removal of the trocars with good hemostasis noted. The umbilical wound was irrigated and the fascia was then closed with the pursestring suture.  Local anesthetic was infiltrated at all port sites. The skin incisions were closed with 4-0 Monocril subcuticular sutures and steri-strips and dressings were applied.  Instrument, sponge, and needle counts were correct at the conclusion of the case.  The patient was awakened from anesthesia and brought to the recovery room in stable condition.  The patient tolerated the procedure well.   Earnstine Regal, MD, [facility] Office: [phone]

## 2013-06-20 NOTE — Consult Note (Signed)
Reason for Consult:  Symptomatic cholelithiasis  Referring Physician: Dr. Carol Ada   Katie Oneill is an 53 y.o. female.  HPI: healthy 53 y/o who developed severe pain abdomen,. No nausea or vomiting.  She couldn't make it better with anything and got to where she couldn't walk.  Brought to ER by  EMS, and work up found her to have choledocholithiasis.  WBC mildly elevated.   She underwent ERCP yesterday and was OK initially, but developed severe pain early this AM around 3.  She is better and able to walk but continues to have pain RUQ and some in the mid abdomen.  Dr. Benson Norway has evaluated her and request we see for symptomatic cholelitiasis. [place] on admit:  1. Choledocholithiasis. Single 0.5 cm stone noted at the distal  common hepatic duct, corresponding to the patient's site of pain. The common hepatic duct remains normal in caliber, without definite  evidence of obstruction. This may reflect an intermittently obstructing distal common hepatic duct stone.  2. Cholelithiasis; gallbladder otherwise unremarkable in appearance.  3. Likely small hemangioma at the left hepatic lobe. Labs this AM show  WBC 7100, LFT's are normal.     Past Medical History  Diagnosis Date  . Hypothyroid   . Asthma   . MWNUUVOZ(366.4) 03/15/2013    Past Surgical History  Procedure Laterality Date  . Nasal sinus surgery      x2  . Strabismus surgery    . Bunionectomy Left   . Abdominal hysterectomy    . Tendon repair Right     hand  . Ercp N/A 06/19/2013    Procedure: ENDOSCOPIC RETROGRADE CHOLANGIOPANCREATOGRAPHY (ERCP);  Surgeon: Beryle Beams, MD;  Location: [facility];  Service: Endoscopy;  Laterality: N/A;    Family History  Problem Relation Age of Onset  . Heart disease Mother   . Heart disease Father   . Melanoma Father     Social History:  reports that she has never smoked. She has never used smokeless tobacco. She reports that she drinks alcohol. She reports that she does not use illicit  drugs.  Allergies:  Allergies  Allergen Reactions  . Penicillins Hives  . Sulfa Antibiotics Hives    Medications:  Prior to Admission:  Prescriptions prior to admission  Medication Sig Dispense Refill  . Alum Hydroxide-Mag Carbonate (GAVISCON EXTRA STRENGTH PO) Take 1 tablet by mouth as needed (stomach issues).      . Calcium Carbonate-Vitamin D (CALCIUM + D PO) Take 1 tablet by mouth daily.      . Cyanocobalamin (VITAMIN B-12 PO) Take 1 capsule by mouth daily.      . fluticasone (FLONASE) 50 MCG/ACT nasal spray Place 2 sprays into both nostrils daily.       Marland Kitchen levothyroxine (SYNTHROID, LEVOTHROID) 75 MCG tablet Take 75 mcg by mouth every other day.      . levothyroxine (SYNTHROID, LEVOTHROID) 88 MCG tablet Take 88 mcg by mouth daily before breakfast.      . loratadine (CLARITIN) 10 MG tablet Take 10 mg by mouth daily.      Marland Kitchen LORazepam (ATIVAN) 0.5 MG tablet Take 0.5 mg by mouth as needed for anxiety (usually only uses for flight).      . Omega-3 Fatty Acids (FISH OIL PO) Take 1 capsule by mouth daily.      . ranitidine (ZANTAC) 150 MG tablet Take 150 mg by mouth as needed for heartburn (stomach pain).      . SYMBICORT 80-4.5 MCG/ACT inhaler Inhale  2 puffs into the lungs 2 (two) times daily.       Penne Lash HFA 45 MCG/ACT inhaler Inhale 2 puffs into the lungs as needed (SOB).        Scheduled: . budesonide-formoterol  2 puff Inhalation BID  . ciprofloxacin  400 mg Intravenous Q12H  . famotidine  20 mg Oral BID  . fluticasone  2 spray Each Nare Daily  . levothyroxine  88 mcg Oral QAC breakfast  . loratadine  10 mg Oral Daily  . metronidazole  500 mg Intravenous Once  . metronidazole  500 mg Intravenous Q8H  . omega-3 acid ethyl esters  1 g Oral Daily   Continuous:  NWG:NFAOZHYQM, fentaNYL, LORazepam, ondansetron Anti-infectives   Start     Dose/Rate Route Frequency Ordered Stop   06/19/13 0515  ciprofloxacin (CIPRO) IVPB 400 mg     400 mg 200 mL/hr over 60 Minutes Intravenous  Every 12 hours 06/19/13 0506     06/19/13 0515  metroNIDAZOLE (FLAGYL) IVPB 500 mg     500 mg 100 mL/hr over 60 Minutes Intravenous Every 8 hours 06/19/13 0506     06/19/13 0445  piperacillin-tazobactam (ZOSYN) IVPB 3.375 g  Status:  Discontinued     3.375 g 12.5 mL/hr over 240 Minutes Intravenous  Once 06/19/13 0437 06/19/13 0440   06/19/13 0445  ciprofloxacin (CIPRO) IVPB 400 mg     400 mg 200 mL/hr over 60 Minutes Intravenous  Once 06/19/13 0440 06/19/13 0555   06/19/13 0445  metroNIDAZOLE (FLAGYL) IVPB 500 mg     500 mg 100 mL/hr over 60 Minutes Intravenous  Once 06/19/13 0440        Results for orders placed during the hospital encounter of 06/18/13 (from the past 48 hour(s))  CBC WITH DIFFERENTIAL     Status: Abnormal   Collection Time    06/19/13  1:18 AM      Result Value Ref Range   WBC 11.4 (*) 4.0 - 10.5 K/uL   RBC 4.64  3.87 - 5.11 MIL/uL   Hemoglobin 13.4  12.0 - 15.0 g/dL   HCT 39.9  36.0 - 46.0 %   MCV 86.0  78.0 - 100.0 fL   MCH 28.9  26.0 - 34.0 pg   MCHC 33.6  30.0 - 36.0 g/dL   RDW 12.3  11.5 - 15.5 %   Platelets 279  150 - 400 K/uL   Neutrophils Relative % 71  43 - 77 %   Neutro Abs 8.1 (*) 1.7 - 7.7 K/uL   Lymphocytes Relative 21  12 - 46 %   Lymphs Abs 2.4  0.7 - 4.0 K/uL   Monocytes Relative 7  3 - 12 %   Monocytes Absolute 0.8  0.1 - 1.0 K/uL   Eosinophils Relative 1  0 - 5 %   Eosinophils Absolute 0.1  0.0 - 0.7 K/uL   Basophils Relative 0  0 - 1 %   Basophils Absolute 0.0  0.0 - 0.1 K/uL  COMPREHENSIVE METABOLIC PANEL     Status: Abnormal   Collection Time    06/19/13  1:18 AM      Result Value Ref Range   Sodium 139  137 - 147 mEq/L   Potassium 3.9  3.7 - 5.3 mEq/L   Chloride 101  96 - 112 mEq/L   CO2 26  19 - 32 mEq/L   Glucose, Bld 116 (*) 70 - 99 mg/dL   BUN 15  6 - 23 mg/dL  Creatinine, Ser 0.73  0.50 - 1.10 mg/dL   Calcium 9.4  8.4 - 10.5 mg/dL   Total Protein 6.6  6.0 - 8.3 g/dL   Albumin 3.6  3.5 - 5.2 g/dL   AST 23  0 - 37 U/L    ALT 20  0 - 35 U/L   Alkaline Phosphatase 62  39 - 117 U/L   Total Bilirubin 0.2 (*) 0.3 - 1.2 mg/dL   GFR calc non Af Amer >90  >90 mL/min   GFR calc Af Amer >90  >90 mL/min   Comment: (NOTE)     The eGFR has been calculated using the CKD EPI equation.     This calculation has not been validated in all clinical situations.     eGFR's persistently <90 mL/min signify possible Chronic Kidney     Disease.  LIPASE, BLOOD     Status: None   Collection Time    06/19/13  1:18 AM      Result Value Ref Range   Lipase 42  11 - 59 U/L  I-STAT TROPOININ, ED     Status: None   Collection Time    06/19/13  1:41 AM      Result Value Ref Range   Troponin i, poc 0.00  0.00 - 0.08 ng/mL   Comment 3            Comment: Due to the release kinetics of cTnI,     a negative result within the first hours     of the onset of symptoms does not rule out     myocardial infarction with certainty.     If myocardial infarction is still suspected,     repeat the test at appropriate intervals.  URINALYSIS, ROUTINE W REFLEX MICROSCOPIC     Status: Abnormal   Collection Time    06/19/13  2:16 AM      Result Value Ref Range   Color, Urine YELLOW  YELLOW   APPearance CLEAR  CLEAR   Specific Gravity, Urine 1.004 (*) 1.005 - 1.030   pH 8.0  5.0 - 8.0   Glucose, UA NEGATIVE  NEGATIVE mg/dL   Hgb urine dipstick NEGATIVE  NEGATIVE   Bilirubin Urine NEGATIVE  NEGATIVE   Ketones, ur NEGATIVE  NEGATIVE mg/dL   Protein, ur NEGATIVE  NEGATIVE mg/dL   Urobilinogen, UA 0.2  0.0 - 1.0 mg/dL   Nitrite NEGATIVE  NEGATIVE   Leukocytes, UA NEGATIVE  NEGATIVE   Comment: MICROSCOPIC NOT DONE ON URINES WITH NEGATIVE PROTEIN, BLOOD, LEUKOCYTES, NITRITE, OR GLUCOSE <1000 mg/dL.  COMPREHENSIVE METABOLIC PANEL     Status: Abnormal   Collection Time    06/20/13  3:58 AM      Result Value Ref Range   Sodium 139  137 - 147 mEq/L   Potassium 3.7  3.7 - 5.3 mEq/L   Chloride 105  96 - 112 mEq/L   CO2 24  19 - 32 mEq/L    Glucose, Bld 98  70 - 99 mg/dL   BUN 7  6 - 23 mg/dL   Creatinine, Ser 0.80  0.50 - 1.10 mg/dL   Calcium 8.7  8.4 - 10.5 mg/dL   Total Protein 5.5 (*) 6.0 - 8.3 g/dL   Albumin 2.9 (*) 3.5 - 5.2 g/dL   AST 12  0 - 37 U/L   ALT 14  0 - 35 U/L   Alkaline Phosphatase 53  39 - 117 U/L   Total Bilirubin 0.4  0.3 - 1.2 mg/dL   GFR calc non Af Amer 84 (*) >90 mL/min   GFR calc Af Amer >90  >90 mL/min   Comment: (NOTE)     The eGFR has been calculated using the CKD EPI equation.     This calculation has not been validated in all clinical situations.     eGFR's persistently <90 mL/min signify possible Chronic Kidney     Disease.  CBC     Status: None   Collection Time    06/20/13  3:58 AM      Result Value Ref Range   WBC 7.1  4.0 - 10.5 K/uL   RBC 4.45  3.87 - 5.11 MIL/uL   Hemoglobin 12.7  12.0 - 15.0 g/dL   HCT 38.0  36.0 - 46.0 %   MCV 85.4  78.0 - 100.0 fL   MCH 28.5  26.0 - 34.0 pg   MCHC 33.4  30.0 - 36.0 g/dL   RDW 12.5  11.5 - 15.5 %   Platelets 275  150 - 400 K/uL    US Abdomen Complete  06/19/2013   CLINICAL DATA:  Right upper quadrant abdominal pain and leukocytosis.  EXAM: ULTRASOUND ABDOMEN COMPLETE  COMPARISON:  None.  FINDINGS: Gallbladder:  Two mobile gallstones are seen, measuring up to 5 mm in size. No gallbladder wall thickening or pericholecystic fluid is seen. No ultrasonographic Murphy's sign is elicited.  Common bile duct:  Diameter: 0.6 cm, within normal limits in caliber. A single 0.5 cm stone is noted at the distal common hepatic duct; this corresponds to the patient's site of pain, without definite evidence of obstruction.  Liver:  A hyperechoic focus is noted at the left hepatic lobe, measuring 1.2 x 1.1 x 1.1 cm, likely reflecting a small hemangioma. Within normal limits in parenchymal echogenicity.  IVC:  No abnormality visualized.  Pancreas:  Visualized portion unremarkable.  Spleen:  Size and appearance within normal limits.  Right Kidney:  Length: 10.1 cm.  Echogenicity within normal limits. No mass or hydronephrosis visualized.  Left Kidney:  Length: 10.8 cm. Echogenicity within normal limits. No mass or hydronephrosis visualized.  Abdominal aorta:  No aneurysm visualized.  Other findings:  None.  IMPRESSION: 1. Choledocholithiasis. Single 0.5 cm stone noted at the distal common hepatic duct, corresponding to the patient's site of pain. The common hepatic duct remains normal in caliber, without definite evidence of obstruction. This may reflect an intermittently obstructing distal common hepatic duct stone. 2. Cholelithiasis; gallbladder otherwise unremarkable in appearance. 3. Likely small hemangioma at the left hepatic lobe.   Electronically Signed   By: Garald Balding M.D.   On: 06/19/2013 03:54   Dg Ercp Biliary & Pancreatic Ducts  06/19/2013   CLINICAL DATA:  Choledocholithiasis  EXAM: ERCP , sphincterotomy  TECHNIQUE: Multiple spot images obtained with the fluoroscopic device and submitted for interpretation post-procedure.  COMPARISON:  Abdominal ultrasound 06/19/2013  FINDINGS: A total of 9 intraoperative spot images were obtained during the ERCP. The images demonstrate wire cannulation of the main pancreatic duct and common bile duct. Contrast opacification demonstrates a borderline dilated common bile duct. The cystic duct is patent. Contrast material partially fills the gallbladder. No intrahepatic biliary ductal dilatation. No definite choledocholithiasis on these images. The final images demonstrate balloon sweeping of the common duct.  IMPRESSION: ERCP and sphincterotomy as above.  The cystic duct is patent.  No definite choledocholithiasis.  These images were submitted for radiologic interpretation only. Please see the procedural report for  the amount of contrast and the fluoroscopy time utilized.   Electronically Signed   By: Jacqulynn Cadet M.D.   On: 06/19/2013 18:35    Review of Systems  Constitutional: Negative.   HENT: Negative.   Eyes:  Negative.   Respiratory: Positive for wheezing (occasional take symbicort Bid).   Cardiovascular: Negative.   Gastrointestinal: Positive for abdominal pain. Negative for heartburn, nausea, vomiting, diarrhea, constipation, blood in stool and melena.  Genitourinary: Negative.   Musculoskeletal: Negative.   Skin: Negative.   Neurological: Negative.   Endo/Heme/Allergies: Negative.   Psychiatric/Behavioral: Negative.    Blood pressure 119/79, pulse 67, temperature 98.4 F (36.9 C), temperature source Oral, resp. rate 16, height 5' 2"  (1.575 m), weight 66.3 kg (146 lb 2.6 oz), SpO2 97.00%. Physical Exam  Constitutional: She is oriented to person, place, and time. She appears well-developed and well-nourished. No distress.  HENT:  Head: Normocephalic and atraumatic.  Nose: Nose normal.  Eyes: Conjunctivae and EOM are normal. Pupils are equal, round, and reactive to light. Right eye exhibits no discharge. Left eye exhibits no discharge. No scleral icterus.  Neck: Normal range of motion. Neck supple. No JVD present. No tracheal deviation present. No thyromegaly present.  Cardiovascular: Normal rate, regular rhythm, normal heart sounds and intact distal pulses.  Exam reveals no gallop.   No murmur heard. Respiratory: Effort normal and breath sounds normal. No respiratory distress. She has no wheezes. She has no rales. She exhibits no tenderness.  GI: Soft. Bowel sounds are normal. She exhibits no distension (mid and RUQ) and no mass. There is tenderness. There is no rebound and no guarding.  Musculoskeletal: She exhibits no edema and no tenderness.  Lymphadenopathy:    She has no cervical adenopathy.  Neurological: She is alert and oriented to person, place, and time. She has normal reflexes. She displays normal reflexes. No cranial nerve deficit. She exhibits normal muscle tone. Coordination normal.  Skin: Skin is warm and dry. No rash noted. She is not diaphoretic. No erythema. No pallor.   Psychiatric: She has a normal mood and affect. Her behavior is normal. Judgment and thought content normal.    Assessment/Plan: 1.  Symptomatic cholelithiasis 2.  Asthma 3. Hypothyroid 4.  Headaches   Plan:  Pt NPO, nothing PO since 6 AM.  I will discuss and see if we can get her surgery scheduled for later today.  Earnstine Regal 06/20/2013, 8:19 AM

## 2013-06-20 NOTE — Anesthesia Postprocedure Evaluation (Signed)
Anesthesia Post Note  Patient: Katie Oneill  Procedure(s) Performed: Procedure(s) (LRB): LAPAROSCOPIC CHOLECYSTECTOMY WITH INTRAOPERATIVE CHOLANGIOGRAM (N/A)  Anesthesia type: General  Patient location: PACU  Post pain: Pain level controlled  Post assessment: Post-op Vital signs reviewed  Last Vitals: BP 145/78  Pulse 60  Temp(Src) 36.6 C (Oral)  Resp 15  Ht 5\' 2"  (1.575 m)  Wt 146 lb 2.6 oz (66.3 kg)  BMI 26.73 kg/m2  SpO2 100%  Post vital signs: Reviewed  Level of consciousness: sedated  Complications: Oneill apparent anesthesia complications

## 2013-06-20 NOTE — Progress Notes (Signed)
Subjective: Recurrent pain in the RUQ last evening.  The pain is the same as her admission pain.  Objective: Vital signs in last 24 hours: Temp:  [98.1 F (36.7 C)-98.9 F (37.2 C)] 98.4 F (36.9 C) (04/29 0545) Pulse Rate:  [65-119] 67 (04/29 0545) Resp:  [13-27] 16 (04/29 0545) BP: (106-154)/(57-107) 119/79 mmHg (04/29 0545) SpO2:  [97 %-100 %] 97 % (04/29 0545) Weight:  [146 lb 2.6 oz (66.3 kg)] 146 lb 2.6 oz (66.3 kg) (04/29 0433) Last BM Date: 06/18/13  Intake/Output from previous day: 04/28 0701 - 04/29 0700 In: 1980 [P.O.:1080; IV Piggyback:900] Out: 2400 [Urine:2400] Intake/Output this shift:    General appearance: alert and no distress GI: tender in the RUQ and epigastric region  Lab Results:  Recent Labs  06/19/13 0118 06/20/13 0358  WBC 11.4* 7.1  HGB 13.4 12.7  HCT 39.9 38.0  PLT 279 275   BMET  Recent Labs  06/19/13 0118 06/20/13 0358  NA 139 139  K 3.9 3.7  CL 101 105  CO2 26 24  GLUCOSE 116* 98  BUN 15 7  CREATININE 0.73 0.80  CALCIUM 9.4 8.7   LFT  Recent Labs  06/20/13 0358  PROT 5.5*  ALBUMIN 2.9*  AST 12  ALT 14  ALKPHOS 53  BILITOT 0.4   PT/INR No results found for this basename: LABPROT, INR,  in the last 72 hours Hepatitis Panel No results found for this basename: HEPBSAG, HCVAB, HEPAIGM, HEPBIGM,  in the last 72 hours C-Diff No results found for this basename: CDIFFTOX,  in the last 72 hours Fecal Lactopherrin No results found for this basename: FECLLACTOFRN,  in the last 72 hours  Studies/Results: US Abdomen Complete  06/19/2013   CLINICAL DATA:  Right upper quadrant abdominal pain and leukocytosis.  EXAM: ULTRASOUND ABDOMEN COMPLETE  COMPARISON:  None.  FINDINGS: Gallbladder:  Two mobile gallstones are seen, measuring up to 5 mm in size. No gallbladder wall thickening or pericholecystic fluid is seen. No ultrasonographic Murphy's sign is elicited.  Common bile duct:  Diameter: 0.6 cm, within normal limits in caliber.  A single 0.5 cm stone is noted at the distal common hepatic duct; this corresponds to the patient's site of pain, without definite evidence of obstruction.  Liver:  A hyperechoic focus is noted at the left hepatic lobe, measuring 1.2 x 1.1 x 1.1 cm, likely reflecting a small hemangioma. Within normal limits in parenchymal echogenicity.  IVC:  No abnormality visualized.  Pancreas:  Visualized portion unremarkable.  Spleen:  Size and appearance within normal limits.  Right Kidney:  Length: 10.1 cm. Echogenicity within normal limits. No mass or hydronephrosis visualized.  Left Kidney:  Length: 10.8 cm. Echogenicity within normal limits. No mass or hydronephrosis visualized.  Abdominal aorta:  No aneurysm visualized.  Other findings:  None.  IMPRESSION: 1. Choledocholithiasis. Single 0.5 cm stone noted at the distal common hepatic duct, corresponding to the patient's site of pain. The common hepatic duct remains normal in caliber, without definite evidence of obstruction. This may reflect an intermittently obstructing distal common hepatic duct stone. 2. Cholelithiasis; gallbladder otherwise unremarkable in appearance. 3. Likely small hemangioma at the left hepatic lobe.   Electronically Signed   By: Garald Balding M.D.   On: 06/19/2013 03:54   Dg Ercp Biliary & Pancreatic Ducts  06/19/2013   CLINICAL DATA:  Choledocholithiasis  EXAM: ERCP , sphincterotomy  TECHNIQUE: Multiple spot images obtained with the fluoroscopic device and submitted for interpretation post-procedure.  COMPARISON:  Abdominal ultrasound 06/19/2013  FINDINGS: A total of 9 intraoperative spot images were obtained during the ERCP. The images demonstrate wire cannulation of the main pancreatic duct and common bile duct. Contrast opacification demonstrates a borderline dilated common bile duct. The cystic duct is patent. Contrast material partially fills the gallbladder. No intrahepatic biliary ductal dilatation. No definite choledocholithiasis on  these images. The final images demonstrate balloon sweeping of the common duct.  IMPRESSION: ERCP and sphincterotomy as above.  The cystic duct is patent.  No definite choledocholithiasis.  These images were submitted for radiologic interpretation only. Please see the procedural report for the amount of contrast and the fluoroscopy time utilized.   Electronically Signed   By: Jacqulynn Cadet M.D.   On: 06/19/2013 18:35    Medications:  Scheduled: . budesonide-formoterol  2 puff Inhalation BID  . ciprofloxacin  400 mg Intravenous Q12H  . famotidine  20 mg Oral BID  . fluticasone  2 spray Each Nare Daily  . levothyroxine  88 mcg Oral QAC breakfast  . loratadine  10 mg Oral Daily  . metronidazole  500 mg Intravenous Once  . metronidazole  500 mg Intravenous Q8H  . omega-3 acid ethyl esters  1 g Oral Daily   Continuous:   Assessment/Plan: 1) Symptomatic cholelithiasis.   Her current pain is the same as her admission pain and she is comfortable at this time.  No clinical evidence of post ERCP pancreatitis.  I placed a consultation with Surgery today.  Plan: 1) Lap chole per Surgery.   LOS: 2 days   Beryle Beams 06/20/2013, 7:45 AM

## 2013-06-20 NOTE — Anesthesia Preprocedure Evaluation (Signed)
Anesthesia Evaluation  Patient identified by MRN, date of birth, ID band Patient awake    Reviewed: Allergy & Precautions, H&P , NPO status , Patient's Chart, lab work & pertinent test results  Airway Mallampati: II TM Distance: >3 FB Neck ROM: Full    Dental  (+) Dental Advisory Given   Pulmonary asthma ,  breath sounds clear to auscultation        Cardiovascular negative cardio ROS  Rhythm:Regular Rate:Normal     Neuro/Psych  Headaches, negative psych ROS   GI/Hepatic negative GI ROS, Neg liver ROS,   Endo/Other  Hypothyroidism   Renal/GU negative Renal ROS     Musculoskeletal negative musculoskeletal ROS (+)   Abdominal   Peds  Hematology negative hematology ROS (+)   Anesthesia Other Findings   Reproductive/Obstetrics negative OB ROS                           Anesthesia Physical Anesthesia Plan  ASA: II  Anesthesia Plan: General   Post-op Pain Management:    Induction: Intravenous  Airway Management Planned: Oral ETT  Additional Equipment:   Intra-op Plan:   Post-operative Plan: Extubation in OR  Informed Consent: I have reviewed the patients History and Physical, chart, labs and discussed the procedure including the risks, benefits and alternatives for the proposed anesthesia with the patient or authorized representative who has indicated his/her understanding and acceptance.   Dental advisory given  Plan Discussed with: CRNA  Anesthesia Plan Comments:         Anesthesia Quick Evaluation

## 2013-06-20 NOTE — Discharge Instructions (Signed)
Endoscopic Retrograde Cholangiopancreatography (ERCP) Endoscopic retrograde cholangiopancreatography (ERCP) is a procedure used to diagnosis many diseases of the pancreas, bile ducts, liver, and gallbladder. During ERCP a thin, lighted tube (endoscope) is passed through the mouth and down the back of the throat into the first part of the small intestine (duodenum). A small, plastic tube (cannula) is then passed through the endoscope and directed into the bile duct or pancreatic duct. Dye is then injected through the cannula and X-rays are taken to study the biliary and pancreatic passageways.  LET [facility] CARE PROVIDER KNOW ABOUT:   Any allergies you have.   All medicines you are taking, including vitamins, herbs, eyedrops, creams, and over-the-counter medicines.   Previous problems you or members of your family have had with the use of anesthetics.   Any blood disorders you have.   Previous surgeries you have had.   Medical conditions you have. RISKS AND COMPLICATIONS Generally, ERCP is a safe procedure. However, as with any procedure, complications can occur. A simple removal of gallstones has the lowest rate of complications. Higher rates of complication occur in people who have poorly functioning bile or pancreatic ducts. Possible complications include:   Pancreatitis.  Bleeding.  Accidental punctures in the bowel wall, pancreas, or gall bladder.  Gall bladder or bile duct infection. BEFORE THE PROCEDURE   Do not eat or drink anything, including water, for at least 8 hours before the procedure or as directed by your health care provider.   Ask your health care provider whether you should stop taking certain medicines prior to your procedure.   Arrange for someone to drive you home. You will not be allowed to drive for 12 24 hours after the procedure. PROCEDURE   You will be given medicine through a vein (intravenously) to make you relaxed and sleepy.   You might  have a breathing tube placed to give you medicine that makes you sleep (general anesthetic).   Your throat may be sprayed with medicine that numbs the area and prevents gagging (local anesthetic), or you may gargle this medicine.   You will lie on your left side.   The endoscope will be inserted through your mouth and into the duodenum. The tube will not interfere with your breathing. Gagging is prevented by the anesthesia.   While X-rays are being taken, you may be positioned on your stomach.   A small sample of tissue (biopsy) may be removed for examination. AFTER THE PROCEDURE   You will rest in bed until you are fully conscious.   When you first wake up, your throat may feel slightly sore.   You will not be allowed to eat or drink until numbness subsides.   Once you are able to drink, urinate, and sit on the edge of the bed without feeling sick to your stomach (nauseous) or dizzy, you may be allowed to go home. Document Released: 11/03/2000 Document Revised: 11/29/2012 Document Reviewed: 09/19/2012 [facility] Patient Information 2014 [place], [place].    Laparoscopic Cholecystectomy, Care After Refer to this sheet in the next few weeks. These instructions provide you with information on caring for yourself after your procedure. Your health care provider may also give you more specific instructions. Your treatment has been planned according to current medical practices, but problems sometimes occur. Call your health care provider if you have any problems or questions after your procedure. WHAT TO EXPECT AFTER THE PROCEDURE After your procedure, it is typical to have the following:  Pain at your  incision sites. You will be given pain medicines to control the pain.  Mild nausea or vomiting. This should improve after the first 24 hours.  Bloating and possibly shoulder pain from the gas used during the procedure. This will improve after the first 24 hours. HOME CARE  INSTRUCTIONS   Change bandages (dressings) as directed by your health care provider.  Keep the wound dry and clean. You may wash the wound gently with soap and water. Gently blot or dab the area dry.  Do not take baths or use swimming pools or hot tubs for 2 weeks or until your health care provider approves.  Only take over-the-counter or prescription medicines as directed by your health care provider.  Continue your normal diet as directed by your health care provider.  Do not lift anything heavier than 10 pounds (4.5 kg) until your health care provider approves.  Do not play contact sports for 1 week or until your health care provider approves. SEEK MEDICAL CARE IF:   You have redness, swelling, or increasing pain in the wound.  You notice yellowish-white fluid (pus) coming from the wound.  You have drainage from the wound that lasts longer than 1 day.  You notice a bad smell coming from the wound or dressing.  Your surgical cuts (incisions) break open. SEEK IMMEDIATE MEDICAL CARE IF:   You develop a rash.  You have difficulty breathing.  You have chest pain.  You have a fever.  You have increasing pain in the shoulders (shoulder strap areas).  You have dizzy episodes or faint while standing.  You have severe abdominal pain.  You feel sick to your stomach (nauseous) or throw up (vomit) and this lasts for more than 1 day. Document Released: 02/08/2005 Document Revised: 11/29/2012 Document Reviewed: 09/20/2012 [facility] Patient Information 2014 [place].  CCS ______[facility] LAPAROSCOPIC SURGERY: POST OP INSTRUCTIONS Always review your discharge instruction sheet given to you by the facility where your surgery was performed. IF YOU HAVE DISABILITY OR FAMILY LEAVE FORMS, YOU MUST BRING THEM TO THE OFFICE FOR PROCESSING.   DO NOT GIVE THEM TO YOUR DOCTOR.  1. A prescription for pain medication may be given to you upon discharge.  Take your  pain medication as prescribed, if needed.  If narcotic pain medicine is not needed, then you may take acetaminophen (Tylenol) or ibuprofen (Advil) as needed. 2. Take your usually prescribed medications unless otherwise directed. 3. If you need a refill on your pain medication, please contact your pharmacy.  They will contact our office to request authorization. Prescriptions will not be filled after 5pm or on week-ends. 4. You should follow a light diet the first few days after arrival home, such as soup and crackers, etc.  Be sure to include lots of fluids daily. 5. Most patients will experience some swelling and bruising in the area of the incisions.  Ice packs will help.  Swelling and bruising can take several days to resolve.  6. It is common to experience some constipation if taking pain medication after surgery.  Increasing fluid intake and taking a stool softener (such as Colace) will usually help or prevent this problem from occurring.  A mild laxative (Milk of Magnesia or Miralax) should be taken according to package instructions if there are no bowel movements after 48 hours. 7. Unless discharge instructions indicate otherwise, you may remove your bandages 24-48 hours after surgery, and you may shower at that time.  You may have steri-strips (small skin  tapes) in place directly over the incision.  These strips should be left on the skin for 7-10 days.  If your surgeon used skin glue on the incision, you may shower in 24 hours.  The glue will flake off over the next 2-3 weeks.  Any sutures or staples will be removed at the office during your follow-up visit. 8. ACTIVITIES:  You may resume regular (light) daily activities beginning the next day--such as daily self-care, walking, climbing stairs--gradually increasing activities as tolerated.  You may have sexual intercourse when it is comfortable.  Refrain from any heavy lifting or straining until approved by your doctor. a. You may drive when you are  no longer taking prescription pain medication, you can comfortably wear a seatbelt, and you can safely maneuver your car and apply brakes. b. RETURN TO WORK:  __________________________________________________________ 9. You should see your doctor in the office for a follow-up appointment approximately 2-3 weeks after your surgery.  Make sure that you call for this appointment within a day or two after you arrive home to insure a convenient appointment time. 10. OTHER INSTRUCTIONS: __________________________________________________________________________________________________________________________ __________________________________________________________________________________________________________________________ WHEN TO CALL YOUR DOCTOR: 1. Fever over 101.0 2. Inability to urinate 3. Continued bleeding from incision. 4. Increased pain, redness, or drainage from the incision. 5. Increasing abdominal pain  The clinic staff is available to answer your questions during regular business hours.  Please dont hesitate to call and ask to speak to one of the nurses for clinical concerns.  If you have a medical emergency, go to the nearest emergency room or call 911.  A surgeon from [facility] is always on call at the hospital. [address], [place], [place], [place]  [address] ? [address], [place], [place]   [address] [phone] ? [phone] ? FAX [phone] Web site: [email or web address]

## 2013-06-20 NOTE — Consult Note (Signed)
General Surgery [facility]  Patient seen and examined.  Discussed history and studies performed past two days.  Discussed lap chole with IOC and possibility of open cholecystectomy.  Will proceed with lap chole this afternoon.  The risks and benefits of the procedure have been discussed at length with the patient.  The patient understands the proposed procedure, potential alternative treatments, and the course of recovery to be expected.  All of the patient's questions have been answered at this time.  The patient wishes to proceed with surgery.  Earnstine Regal, MD, [facility] Office: [phone]

## 2013-06-21 ENCOUNTER — Encounter (HOSPITAL_COMMUNITY): Payer: Self-pay | Admitting: Surgery

## 2013-06-21 LAB — COMPREHENSIVE METABOLIC PANEL
ALT: 34 U/L (ref 0–35)
AST: 36 U/L (ref 0–37)
Albumin: 2.9 g/dL — ABNORMAL LOW (ref 3.5–5.2)
Alkaline Phosphatase: 53 U/L (ref 39–117)
BUN: 6 mg/dL (ref 6–23)
CO2: 25 meq/L (ref 19–32)
Calcium: 9.1 mg/dL (ref 8.4–10.5)
Chloride: 104 mEq/L (ref 96–112)
Creatinine, Ser: 0.79 mg/dL (ref 0.50–1.10)
GFR calc non Af Amer: >90 mL/min (ref >90)
GLUCOSE: 138 mg/dL — AB (ref 70–99)
POTASSIUM: 4.2 meq/L (ref 3.7–5.3)
Sodium: 138 mEq/L (ref 137–147)
TOTAL PROTEIN: 5.5 g/dL — AB (ref 6.0–8.3)
Total Bilirubin: 0.3 mg/dL (ref 0.3–1.2)

## 2013-06-21 LAB — CBC
HCT: 38.6 % (ref 36.0–46.0)
Hemoglobin: 12.4 g/dL (ref 12.0–15.0)
MCH: 27.6 pg (ref 26.0–34.0)
MCHC: 32.1 g/dL (ref 30.0–36.0)
MCV: 86 fL (ref 78.0–100.0)
Platelets: 243 10*3/uL (ref 150–400)
RBC: 4.49 MIL/uL (ref 3.87–5.11)
RDW: 12.5 % (ref 11.5–15.5)
WBC: 8 10*3/uL (ref 4.0–10.5)

## 2013-06-21 MED ORDER — LORAZEPAM 0.5 MG PO TABS: 0.5000 mg | ORAL_TABLET | Freq: Two times a day (BID) | ORAL | Status: DC | PRN

## 2013-06-21 MED ORDER — DIPHENHYDRAMINE-ZINC ACETATE 2-0.1 % EX CREA
TOPICAL_CREAM | Freq: Three times a day (TID) | CUTANEOUS | Status: DC | PRN
  Administered 2013-06-21: 17:00:00 via TOPICAL
  Filled 2013-06-21 (×2): qty 28

## 2013-06-21 MED ORDER — ACETAMINOPHEN 325 MG PO TABS
650.0000 mg | ORAL_TABLET | Freq: Four times a day (QID) | ORAL | Status: DC | PRN
  Administered 2013-06-21: 650 mg via ORAL
  Filled 2013-06-21 (×2): qty 2

## 2013-06-21 MED ORDER — ACETAMINOPHEN 325 MG PO TABS: 650.0000 mg | ORAL_TABLET | Freq: Four times a day (QID) | ORAL | Status: AC | PRN

## 2013-06-21 MED ORDER — HYDROCODONE-ACETAMINOPHEN 5-325 MG PO TABS: 1.0000 | ORAL_TABLET | Freq: Four times a day (QID) | ORAL | Status: DC | PRN

## 2013-06-21 NOTE — Progress Notes (Signed)
General Surgery [facility]  Patient seen and examined.  Doing well post op.  Wounds dry with small ecchymosis at umbilical site.  Anticipate discharge home this afternoon.  Will see at [place] office in 3 weeks.  Earnstine Regal, MD, [facility] Office: [phone]

## 2013-06-21 NOTE — Progress Notes (Signed)
Pt was discharged home. Pt was given d/c AVS and verbalized understanding of all medications. Prescriptions were given to her husband. Pt has had no other complaints. Pt thanked staff for care. PT wheeled to car by [place] 06/21/2013 4:50 PM

## 2013-06-21 NOTE — Progress Notes (Signed)
Patient has a low grade temp of 99.5 notified MD. Will wait until 3pm and see how she feels. EKG done and MD has seen it.

## 2013-06-21 NOTE — Discharge Summary (Signed)
Physician Discharge Summary  Katie Oneill GGY:[id number] DOB: November 12, 1960 DOA: 06/18/2013  PCP: Tawanna Solo, MD  Admit date: 06/18/2013 Discharge date: 06/21/2013  Recommendations for Outpatient Follow-up:  1. Pt will need to follow up with PCP in 2-3 weeks post discharge 2. Please obtain BMP to evaluate electrolytes and kidney function 3. Please also check CBC to evaluate Hg and Hct levels  Discharge Diagnoses: Cholelithiasis  Principal Problem:   Abdominal pain Active Problems:   Choledocholithiasis   Cholelithiasis with choledocholithiasis   Discharge Condition: Stable  Diet recommendation: Heart healthy diet discussed in details   Brief narrative:  53 year old female with past medical history of hypothyroidism, asthma who presented to [facility] ED 06/18/2013 with worsening right upper quadrant abdominal pain, 10 out of 10 in intensity, nonradiating and not relieved with rest. Pain started suddenly, she ate eggs and spinach and felt fine but then little later pain started, sharp, stabbing type of pain. She had some nausea but no vomiting. No fevers or chills. Pt reports similar events in the past but not as intense.   In ED, vitals were stable and her blood work unremarkable except for slight leukocytosis of 11.4. Lipase and LFT's were WNL. Abd US showed choledocholithiasis, single 0.5 cm stone noted at the distal common hepatic duct, corresponding to the patient's site of pain; the common hepatic duct was normal in caliber, without definite evidence of obstruction. Dr. Sabra Heck in ED spoke with Dr. Collene Mares of GI who plans to do ERCP this am.   Assessment and Plan:  Principal Problem:  Abdominal pain, Choledocholithiasis  - LFT's and lipase WNL  - s/p ERCP, tolerated well but with persistent discomfort  - status post lap chole post op day #1, pt clinically stable, wants to go home  - Continue analgesia and antiemetics if needed  - no need for ABX  Active Problems:  Hypothyroidism  -  continue synthroid 88 mcg daily   Consultants:  Surgery  GI Procedures/Studies:  US Abdomen Complete 06/19/2013  Choledocholithiasis. Single 0.5 cm stone noted at the distal common hepatic duct, corresponding to the patient's site of pain. The common hepatic duct remains normal in caliber, without definite evidence of obstruction. This may reflect an intermittently obstructing distal common hepatic duct stone. Cholelithiasis; gallbladder otherwise unremarkable in appearance. 3. Likely small hemangioma at the left hepatic lobe.  Dg Ercp Biliary & Pancreatic Ducts 06/19/2013  ERCP and sphincterotomy as above. The cystic duct is patent. No definite choledocholithiasis. These images were submitted for radiologic interpretation only. Please see the procedural report for the amount of contrast and the fluoroscopy time utilized.   Antibiotics:  Cipro 4/28 --> 4/29 Flagyl 4/29 --> 4/29  Code Status: Full  Family Communication: Pt and husband at bedside    Discharge Exam: Filed Vitals:   06/21/13 0500  BP: 101/64  Pulse: 81  Temp: 98.1 F (36.7 C)  Resp: 16   Filed Vitals:   06/20/13 2147 06/21/13 0142 06/21/13 0452 06/21/13 0500  BP: 110/66 97/66  101/64  Pulse: 70 62  81  Temp: 99.2 F (37.3 C) 98.8 F (37.1 C)  98.1 F (36.7 C)  TempSrc: Oral Oral  Oral  Resp: 16 16  16   Height:      Weight:   64.5 kg (142 lb 3.2 oz)   SpO2: 99% 99%  99%    General: Pt is alert, follows commands appropriately, not in acute distress Cardiovascular: Regular rate and rhythm, S1/S2 +, no murmurs, no rubs, no gallops Respiratory:  Clear to auscultation bilaterally, no wheezing, no crackles, no rhonchi Abdominal: Soft, non tender, non distended, bowel sounds +, no guarding Extremities: no edema, no cyanosis, pulses palpable bilaterally DP and PT Neuro: Grossly nonfocal  Discharge Instructions   Future Appointments Provider Department Dept Phone   07/10/2013 3:30 PM [facility] [place] [phone]       Medication List         acetaminophen 325 MG tablet  Commonly known as:  TYLENOL  Take 2 tablets (650 mg total) by mouth every 6 (six) hours as needed for mild pain or moderate pain.     CALCIUM + D PO  Take 1 tablet by mouth daily.     FISH OIL PO  Take 1 capsule by mouth daily.     fluticasone 50 MCG/ACT nasal spray  Commonly known as:  FLONASE  Place 2 sprays into both nostrils daily.     GAVISCON EXTRA STRENGTH PO  Take 1 tablet by mouth as needed (stomach issues).     HYDROcodone-acetaminophen 5-325 MG per tablet  Commonly known as:  NORCO/VICODIN  Take 1 tablet by mouth every 6 (six) hours as needed for moderate pain.     levothyroxine 88 MCG tablet  Commonly known as:  SYNTHROID, LEVOTHROID  Take 88 mcg by mouth daily before breakfast.     levothyroxine 75 MCG tablet  Commonly known as:  SYNTHROID, LEVOTHROID  Take 75 mcg by mouth every other day.     loratadine 10 MG tablet  Commonly known as:  CLARITIN  Take 10 mg by mouth daily.     LORazepam 0.5 MG tablet  Commonly known as:  ATIVAN  Take 1 tablet (0.5 mg total) by mouth 2 (two) times daily as needed for anxiety (usually only uses for flight).     ranitidine 150 MG tablet  Commonly known as:  ZANTAC  Take 150 mg by mouth as needed for heartburn (stomach pain).     SYMBICORT 80-4.5 MCG/ACT inhaler  Generic drug:  budesonide-formoterol  Inhale 2 puffs into the lungs 2 (two) times daily.     VITAMIN B-12 PO  Take 1 capsule by mouth daily.     XOPENEX HFA 45 MCG/ACT inhaler  Generic drug:  levalbuterol  Inhale 2 puffs into the lungs as needed (SOB).           Follow-up Information   Follow up with [facility] [place] On 07/10/2013. (Your appointment is at 3:30 PM, be at the office at 3:00PM for check in.)    Contact information:   [place] [address] [phone]       Follow up with Earnstine Regal, MD. Schedule an  appointment as soon as possible for a visit in 3 weeks. (For wound check)    Specialty:  General Surgery   Contact information:   [place] [address] [phone]       Follow up with Faye Ramsay, MD. (As needed, If symptoms worsen, call my cell phone [phone])    Specialty:  Internal Medicine   Contact information:   [address] [place] [address] [phone]        The results of significant diagnostics from this hospitalization (including imaging, microbiology, ancillary and laboratory) are listed below for reference.     Microbiology: Recent Results (from the past 240 hour(s))  SURGICAL PCR SCREEN  Status: None   Collection Time    06/20/13 11:03 AM      Result Value Ref Range Status   MRSA, PCR NEGATIVE  NEGATIVE Final   Staphylococcus aureus NEGATIVE  NEGATIVE Final   Comment:            The Xpert SA Assay (FDA     approved for NASAL specimens     in patients over 36 years of age),     is one component of     a comprehensive surveillance     program.  Test performance has     been validated by [employer] for patients greater     than or equal to 28 year old.     It is not intended     to diagnose infection nor to     guide or monitor treatment.     Labs: Basic Metabolic Panel:  Recent Labs Lab 06/19/13 0118 06/20/13 0358 06/21/13 0350  NA 139 139 138  K 3.9 3.7 4.2  CL 101 105 104  CO2 26 24 25   GLUCOSE 116* 98 138*  BUN 15 7 6   CREATININE 0.73 0.80 0.79  CALCIUM 9.4 8.7 9.1   Liver Function Tests:  Recent Labs Lab 06/19/13 0118 06/20/13 0358 06/21/13 0350  AST 23 12 36  ALT 20 14 34  ALKPHOS 62 53 53  BILITOT 0.2* 0.4 0.3  PROT 6.6 5.5* 5.5*  ALBUMIN 3.6 2.9* 2.9*    Recent Labs Lab 06/19/13 0118  LIPASE 42   CBC:  Recent Labs Lab 06/19/13 0118 06/20/13 0358 06/21/13 0350  WBC 11.4* 7.1 8.0  NEUTROABS 8.1*  --   --   HGB 13.4 12.7 12.4  HCT 39.9 38.0 38.6  MCV  86.0 85.4 86.0  PLT 279 275 243     SIGNED: Time coordinating discharge: Over 30 minutes  Theodis Blaze, MD  [facility] 06/21/2013, 10:54 AM Pager [phone]  If 7PM-7AM, please contact night-coverage [email or web address] Password [email or web address]

## 2013-06-21 NOTE — Progress Notes (Signed)
1 Day Post-Op  Subjective: Sore but doing well.  She is going to eat a regular diet for lunch and hopefully home after that.  Objective: Vital signs in last 24 hours: Temp:  [97.6 F (36.4 C)-99.2 F (37.3 C)] 98.1 F (36.7 C) (04/30 0500) Pulse Rate:  [56-81] 81 (04/30 0500) Resp:  [10-16] 16 (04/30 0500) BP: (97-158)/(61-78) 101/64 mmHg (04/30 0500) SpO2:  [97 %-100 %] 99 % (04/30 0500) Weight:  [64.5 kg (142 lb 3.2 oz)] 64.5 kg (142 lb 3.2 oz) (04/30 0452) Last BM Date: 06/18/13 600PO Diet: regular Afebrile, VSS Labs OK IOC:  OK Intake/Output from previous day: 04/29 0701 - 04/30 0700 In: 3500 [P.O.:600; I.V.:2400; IV Piggyback:500] Out: 2650 [Urine:2650] Intake/Output this shift: Total I/O In: 120 [P.O.:120] Out: 300 [Urine:300]  General appearance: alert, cooperative and no distress GI: sore, but port sites look good she tolerating diet.  Lab Results:   Recent Labs  06/20/13 0358 06/21/13 0350  WBC 7.1 8.0  HGB 12.7 12.4  HCT 38.0 38.6  PLT 275 243    BMET  Recent Labs  06/20/13 0358 06/21/13 0350  NA 139 138  K 3.7 4.2  CL 105 104  CO2 24 25  GLUCOSE 98 138*  BUN 7 6  CREATININE 0.80 0.79  CALCIUM 8.7 9.1   PT/INR No results found for this basename: LABPROT, INR,  in the last 72 hours   Recent Labs Lab 06/19/13 0118 06/20/13 0358 06/21/13 0350  AST 23 12 36  ALT 20 14 34  ALKPHOS 62 53 53  BILITOT 0.2* 0.4 0.3  PROT 6.6 5.5* 5.5*  ALBUMIN 3.6 2.9* 2.9*     Lipase     Component Value Date/Time   LIPASE 42 06/19/2013 0118     Studies/Results: Dg Cholangiogram Operative  06/20/2013   CLINICAL DATA:  Choledocholithiasis  EXAM: INTRAOPERATIVE CHOLANGIOGRAM  TECHNIQUE: Cholangiographic images from the C-arm fluoroscopic device were submitted for interpretation post-operatively. Please see the procedural report for the amount of contrast and the fluoroscopy time utilized.  COMPARISON:  None.  FINDINGS: No persistent filling defects in  the common duct. Intrahepatic ducts are incompletely visualized, appearing decompressed centrally. Contrast passes into the duodenum.  : Negative for retained common duct stone.   Electronically Signed   By: Arne Cleveland M.D.   On: 06/20/2013 15:43   Dg Ercp Biliary & Pancreatic Ducts  06/19/2013   CLINICAL DATA:  Choledocholithiasis  EXAM: ERCP , sphincterotomy  TECHNIQUE: Multiple spot images obtained with the fluoroscopic device and submitted for interpretation post-procedure.  COMPARISON:  Abdominal ultrasound 06/19/2013  FINDINGS: A total of 9 intraoperative spot images were obtained during the ERCP. The images demonstrate wire cannulation of the main pancreatic duct and common bile duct. Contrast opacification demonstrates a borderline dilated common bile duct. The cystic duct is patent. Contrast material partially fills the gallbladder. No intrahepatic biliary ductal dilatation. No definite choledocholithiasis on these images. The final images demonstrate balloon sweeping of the common duct.  IMPRESSION: ERCP and sphincterotomy as above.  The cystic duct is patent.  No definite choledocholithiasis.  These images were submitted for radiologic interpretation only. Please see the procedural report for the amount of contrast and the fluoroscopy time utilized.   Electronically Signed   By: Jacqulynn Cadet M.D.   On: 06/19/2013 18:35    Medications: . budesonide-formoterol  2 puff Inhalation BID  . famotidine  20 mg Oral BID  . fluticasone  2 spray Each Nare Daily  .  levothyroxine  88 mcg Oral QAC breakfast  . loratadine  10 mg Oral Daily  . metronidazole  500 mg Intravenous Once  . omega-3 acid ethyl esters  1 g Oral Daily    Assessment/Plan 1. Symptomatic cholelithiasis  2. Asthma  3. Hypothyroid  4. Headaches    Plan  Home later today, she will follow up in the clinic in about 3 weeks.   LOS: 3 days    Earnstine Regal 06/21/2013

## 2013-06-27 ENCOUNTER — Ambulatory Visit: Payer: No Typology Code available for payment source | Attending: Family Medicine

## 2013-06-27 DIAGNOSIS — R7989 Other specified abnormal findings of blood chemistry: Secondary | ICD-10-CM | POA: Insufficient documentation

## 2013-06-27 LAB — LIPASE: Lipase: 138 U/L — ABNORMAL HIGH (ref 0–75)

## 2013-06-27 LAB — COMPREHENSIVE METABOLIC PANEL
ALBUMIN: 3.9 g/dL (ref 3.5–5.2)
ALK PHOS: 80 U/L (ref 39–117)
ALT: 29 U/L (ref 0–35)
AST: 22 U/L (ref 0–37)
BUN: 10 mg/dL (ref 6–23)
CO2: 27 mEq/L (ref 19–32)
Calcium: 9.6 mg/dL (ref 8.4–10.5)
Chloride: 102 mEq/L (ref 96–112)
Creat: 0.85 mg/dL (ref 0.50–1.10)
Glucose, Bld: 98 mg/dL (ref 70–99)
POTASSIUM: 4.7 meq/L (ref 3.5–5.3)
Sodium: 138 mEq/L (ref 135–145)
TOTAL PROTEIN: 6.5 g/dL (ref 6.0–8.3)
Total Bilirubin: 0.4 mg/dL (ref 0.2–1.2)

## 2013-06-27 LAB — HEPATIC FUNCTION PANEL
ALBUMIN: 3.8 g/dL (ref 3.5–5.2)
ALT: 27 U/L (ref 0–35)
AST: 20 U/L (ref 0–37)
Alkaline Phosphatase: 79 U/L (ref 39–117)
BILIRUBIN DIRECT: 0.1 mg/dL (ref 0.0–0.3)
Indirect Bilirubin: 0.3 mg/dL (ref 0.2–1.2)
TOTAL PROTEIN: 6.3 g/dL (ref 6.0–8.3)
Total Bilirubin: 0.4 mg/dL (ref 0.2–1.2)

## 2013-06-27 LAB — AMYLASE: Amylase: 94 U/L (ref 0–105)

## 2013-06-28 ENCOUNTER — Ambulatory Visit (HOSPITAL_COMMUNITY)
Admission: RE | Admit: 2013-06-28 | Discharge: 2013-06-28 | Disposition: A | Discharge status: Home / Self Care | Payer: No Typology Code available for payment source | Source: Ambulatory Visit | Attending: Internal Medicine | Admitting: Internal Medicine

## 2013-06-28 DIAGNOSIS — R7989 Other specified abnormal findings of blood chemistry: Secondary | ICD-10-CM

## 2013-06-28 DIAGNOSIS — R109 Unspecified abdominal pain: Secondary | ICD-10-CM | POA: Insufficient documentation

## 2013-07-10 ENCOUNTER — Ambulatory Visit (INDEPENDENT_AMBULATORY_CARE_PROVIDER_SITE_OTHER): Payer: No Typology Code available for payment source | Admitting: General Surgery

## 2013-07-10 ENCOUNTER — Encounter (INDEPENDENT_AMBULATORY_CARE_PROVIDER_SITE_OTHER): Payer: Self-pay

## 2013-07-10 VITALS — BP 122/78 | HR 64 | Temp 99.0°F | Resp 18 | Ht 62.5 in | Wt 136.0 lb

## 2013-07-10 DIAGNOSIS — K8010 Calculus of gallbladder with chronic cholecystitis without obstruction: Secondary | ICD-10-CM

## 2013-07-10 NOTE — Patient Instructions (Signed)
Call if you have any problems 

## 2013-07-10 NOTE — Progress Notes (Signed)
Katie Oneill [birth year] [id number] 07/10/2013   Katie Oneill is a 53 y.o. female who had a laparoscopic cholecystectomy with intraoperative cholangiogram by Dr. Armandina Gemma.  The pathology report confirmed: Gallbladder - CHRONIC CHOLECYSTITIS. - CHOLELITHIASIS.Marland Kitchen  The patient reports that they are feeling well with normal bowel movements and good appetite.  The pre-operative symptoms of abdominal pain, nausea, and vomiting have resolved.    Physical examination - BP 122/78  Pulse 64  Temp(Src) 99 F (37.2 C)  Resp 18  Ht 5' 2.5" (1.588 m)  Wt 61.689 kg (136 lb)  BMI 24.46 kg/m2  Incisions appear well-healed with no sign of infection or bleeding.   Abdomen - soft, non-tender  Impression:  s/p laparoscopic cholecystectomy                       Hypothyroid Plan:  She may resume a regular diet and full activity.  She may follow-up on a PRN basis.

## 2013-08-09 ENCOUNTER — Other Ambulatory Visit: Payer: Self-pay

## 2013-08-09 DIAGNOSIS — Z1231 Encounter for screening mammogram for malignant neoplasm of breast: Secondary | ICD-10-CM

## 2013-08-16 ENCOUNTER — Ambulatory Visit
Admission: RE | Admit: 2013-08-16 | Discharge: 2013-08-16 | Disposition: A | Discharge status: Home / Self Care | Payer: No Typology Code available for payment source | Source: Ambulatory Visit

## 2013-08-16 DIAGNOSIS — Z1231 Encounter for screening mammogram for malignant neoplasm of breast: Secondary | ICD-10-CM

## 2013-11-21 ENCOUNTER — Ambulatory Visit (INDEPENDENT_AMBULATORY_CARE_PROVIDER_SITE_OTHER): Payer: No Typology Code available for payment source | Admitting: Internal Medicine

## 2013-11-21 ENCOUNTER — Encounter: Payer: Self-pay | Admitting: Internal Medicine

## 2013-11-21 VITALS — BP 122/69 | HR 79 | Temp 98.7°F | Resp 16 | Ht 61.75 in | Wt 144.0 lb

## 2013-11-21 DIAGNOSIS — Z90710 Acquired absence of both cervix and uterus: Secondary | ICD-10-CM

## 2013-11-21 DIAGNOSIS — E038 Other specified hypothyroidism: Secondary | ICD-10-CM

## 2013-11-21 DIAGNOSIS — J309 Allergic rhinitis, unspecified: Secondary | ICD-10-CM | POA: Insufficient documentation

## 2013-11-21 DIAGNOSIS — J3089 Other allergic rhinitis: Secondary | ICD-10-CM

## 2013-11-21 DIAGNOSIS — F411 Generalized anxiety disorder: Secondary | ICD-10-CM

## 2013-11-21 DIAGNOSIS — E039 Hypothyroidism, unspecified: Secondary | ICD-10-CM | POA: Insufficient documentation

## 2013-11-21 DIAGNOSIS — J45909 Unspecified asthma, uncomplicated: Secondary | ICD-10-CM

## 2013-11-21 DIAGNOSIS — R51 Headache: Secondary | ICD-10-CM

## 2013-11-21 DIAGNOSIS — G250 Essential tremor: Secondary | ICD-10-CM

## 2013-11-21 DIAGNOSIS — R42 Dizziness and giddiness: Secondary | ICD-10-CM

## 2013-11-21 DIAGNOSIS — G252 Other specified forms of tremor: Secondary | ICD-10-CM

## 2013-11-21 LAB — EKG 12-LEAD

## 2013-11-21 NOTE — Patient Instructions (Signed)
To lab today   Will set up referral to Dr. Loretta Plume

## 2013-11-21 NOTE — Progress Notes (Addendum)
Subjective:    Patient ID: Katie Oneill, female    DOB: 1960-10-14, 53 y.o.   MRN: [id number]  HPI  Katie Oneill is here as a new pt  First visit .  Former care Dr. 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    She is S/P hysterectomy  And cholecystectomy.      FH of melanoma in father.   Katie Oneill is concerned over persistant headaches and dizziness.  First noted 10 months ago   - dizziness described at "off-balance  Feel like I am walking on a slant"  No spinning sensation,  No pre-syncope symptoms She states repeatedly " I am not a headachy person".  No visual no speech changes no photophobia, no aura,  No N/V with headaches.  Occurs as sharp pain usually on one side of head parietal area.  She did have work up with Dr. Jannifer Franklin with unrevealing MRI/MRA and neg carotid ultrasound.    Headaches and dizziness associated with right hand tremor at times.    Initial symptoms lasted 6 weeks.    Headache and dizziness have returned recently.    She reports Dr. Jannifer Franklin felt she perhaps had a "menopausal migraine syndrome"    She never had headaches during childbearing years.  She took OC's without any headache issues.   Headaches relieved by NSAID.  No FH of MS, Parkinson's or familial tremor   Also notes cramping in calf muscles and feet mostly at night.  "My toes curl "      Allergies  Allergen Reactions  . Penicillins Hives  . Sulfa Antibiotics Hives   Past Medical History  Diagnosis Date  . Hypothyroid   . Asthma   . KGURKYHC(623.7) 03/15/2013   Past Surgical History  Procedure Laterality Date  . Nasal sinus surgery      x2  . Strabismus surgery    . Bunionectomy Left   . Abdominal hysterectomy    . Tendon repair Right     hand  . Ercp N/A 06/19/2013    Procedure: ENDOSCOPIC RETROGRADE CHOLANGIOPANCREATOGRAPHY (ERCP);  Surgeon: Beryle Beams, MD;   Location: [facility];  Service: Endoscopy;  Laterality: N/A;  . Cholecystectomy N/A 06/20/2013    Procedure: LAPAROSCOPIC CHOLECYSTECTOMY WITH INTRAOPERATIVE CHOLANGIOGRAM;  Surgeon: Earnstine Regal, MD;  Location: [facility];  Service: General;  Laterality: N/A;   History   Social History  . Marital Status: Married    Spouse Name: N/A    Number of Children: 3  . Years of Education: post grad   Occupational History  . [employer]   Social History Main Topics  . Smoking status: Never Smoker   . Smokeless tobacco: Never Used  . Alcohol Use: 0.5 oz/week    1 drink(s) per week     Comment: rarely  . Drug Use: No  . Sexual Activity: Yes   Other Topics Concern  . Not on file   Social History Narrative  . No narrative on file   Family History  Problem Relation Age of Onset  . Heart disease Mother   . Heart disease Father   . Melanoma Father    Patient Active Problem List   Diagnosis Date Noted  . Cholelithiasis with choledocholithiasis 06/20/2013  . Choledocholithiasis 06/19/2013  . Abdominal pain 06/19/2013   Current Outpatient  Prescriptions on File Prior to Visit  Medication Sig Dispense Refill  . acetaminophen (TYLENOL) 325 MG tablet Take 2 tablets (650 mg total) by mouth every 6 (six) hours as needed for mild pain or moderate pain.  30 tablet  1  . fluticasone (FLONASE) 50 MCG/ACT nasal spray Place 2 sprays into both nostrils daily.       Marland Kitchen levothyroxine (SYNTHROID, LEVOTHROID) 75 MCG tablet Take 75 mcg by mouth every other day.      . levothyroxine (SYNTHROID, LEVOTHROID) 88 MCG tablet Take 88 mcg by mouth daily before breakfast.      . loratadine (CLARITIN) 10 MG tablet Take 10 mg by mouth daily.      Marland Kitchen LORazepam (ATIVAN) 0.5 MG tablet Take 1 tablet (0.5 mg total) by mouth 2 (two) times daily as needed for anxiety (usually only uses for flight).  60 tablet  2  . XOPENEX HFA 45 MCG/ACT inhaler Inhale 2 puffs into the lungs as needed  (SOB).       . Alum Hydroxide-Mag Carbonate (GAVISCON EXTRA STRENGTH PO) Take 1 tablet by mouth as needed (stomach issues).      . Calcium Carbonate-Vitamin D (CALCIUM + D PO) Take 1 tablet by mouth daily.      . Cyanocobalamin (VITAMIN B-12 PO) Take 1 capsule by mouth daily.      . Omega-3 Fatty Acids (FISH OIL PO) Take 1 capsule by mouth daily.       No current facility-administered medications on file prior to visit.        Review of Systems    see HPI Objective:   Physical Exam Physical Exam  Nursing note and vitals reviewed.  Constitutional: She is oriented to person, place, and time. She appears well-developed and well-nourished.  HENT:  Head: Normocephalic and atraumatic.  Cardiovascular: Normal rate and regular rhythm. Exam reveals no gallop and no friction rub.  No murmur heard.  Pulmonary/Chest: Breath sounds normal. She has no wheezes. She has no rales.  Neurological: She is alert and oriented to person, place, and time.  Temporal artery non tender   CNII-XII intact Motor no pronator drift, no tremor noted 5/5 UE and LE musle groups Reflexes 2+ symmetric Sensory intact to microfilament Cerebellar intact FTN  Skin: Skin is warm and dry.  Gait normal no ataxia noted Psychiatric: She has a normal mood and affect. Her behavior is normal.        Assessment & Plan:  S/S complex  :  Headache,  Subjective ataxia , tremor of right hand:  Neurologic imaging unrevealing to etiology of symptoms.    I do not have a strong suspicion that her headaches are hormonally related as she has had no prior menstrual migraines .    Willl get second opinion from Dr. Loretta Plume.  Will get all labs and sed rate today    - doubt temporal arteritis but will check . Pt does not wish a triptan trial at this point.      Asthma  Last Xopenex use months ago   Continue meds  Allergic rhinitis continue meds  Hypothyroidism  Check TSH today   Addendum  I offered pt triptan as per Dr. Jannifer Franklin  recommendation in his note but she states headaches relieved by NSAID and she declines Rx At this point

## 2013-11-22 ENCOUNTER — Encounter: Payer: Self-pay | Admitting: *Deleted

## 2013-11-22 ENCOUNTER — Encounter: Payer: Self-pay | Admitting: Internal Medicine

## 2013-11-22 LAB — CBC WITH DIFFERENTIAL/PLATELET
BASOS ABS: 0 10*3/uL (ref 0.0–0.1)
BASOS PCT: 0 % (ref 0–1)
EOS ABS: 0.1 10*3/uL (ref 0.0–0.7)
EOS PCT: 1 % (ref 0–5)
HEMATOCRIT: 42 % (ref 36.0–46.0)
Hemoglobin: 13.7 g/dL (ref 12.0–15.0)
Lymphocytes Relative: 34 % (ref 12–46)
Lymphs Abs: 2.4 10*3/uL (ref 0.7–4.0)
MCH: 28.1 pg (ref 26.0–34.0)
MCHC: 32.6 g/dL (ref 30.0–36.0)
MCV: 86.1 fL (ref 78.0–100.0)
MONO ABS: 0.7 10*3/uL (ref 0.1–1.0)
Monocytes Relative: 10 % (ref 3–12)
NEUTROS ABS: 3.9 10*3/uL (ref 1.7–7.7)
Neutrophils Relative %: 55 % (ref 43–77)
Platelets: 305 10*3/uL (ref 150–400)
RBC: 4.88 MIL/uL (ref 3.87–5.11)
RDW: 13.1 % (ref 11.5–15.5)
WBC: 7.1 10*3/uL (ref 4.0–10.5)

## 2013-11-22 LAB — VITAMIN D 25 HYDROXY (VIT D DEFICIENCY, FRACTURES): VIT D 25 HYDROXY: 36 ng/mL (ref 30–89)

## 2013-11-22 LAB — SEDIMENTATION RATE: Sed Rate: 5 mm/hr (ref 0–22)

## 2013-11-22 LAB — LIPID PANEL
Cholesterol: 155 mg/dL (ref 0–200)
HDL: 71 mg/dL (ref >39)
LDL CALC: 67 mg/dL (ref 0–99)
Total CHOL/HDL Ratio: 2.2 Ratio
Triglycerides: 85 mg/dL (ref <150)
VLDL: 17 mg/dL (ref 0–40)

## 2013-11-22 LAB — COMPREHENSIVE METABOLIC PANEL
ALBUMIN: 4.3 g/dL (ref 3.5–5.2)
ALK PHOS: 53 U/L (ref 39–117)
ALT: 17 U/L (ref 0–35)
AST: 16 U/L (ref 0–37)
BILIRUBIN TOTAL: 0.4 mg/dL (ref 0.2–1.2)
BUN: 14 mg/dL (ref 6–23)
CO2: 28 meq/L (ref 19–32)
Calcium: 9.6 mg/dL (ref 8.4–10.5)
Chloride: 103 mEq/L (ref 96–112)
Creat: 0.74 mg/dL (ref 0.50–1.10)
GLUCOSE: 95 mg/dL (ref 70–99)
POTASSIUM: 4.3 meq/L (ref 3.5–5.3)
Sodium: 139 mEq/L (ref 135–145)
TOTAL PROTEIN: 6.9 g/dL (ref 6.0–8.3)

## 2013-11-22 LAB — ANA: Anti Nuclear Antibody(ANA): NEGATIVE

## 2013-11-22 LAB — T4, FREE: FREE T4: 1.16 ng/dL (ref 0.80–1.80)

## 2013-11-22 LAB — T3, FREE: T3 FREE: 2.4 pg/mL (ref 2.3–4.2)

## 2013-11-22 LAB — TSH: TSH: 2.33 u[IU]/mL (ref 0.350–4.500)

## 2013-11-27 ENCOUNTER — Other Ambulatory Visit: Payer: Self-pay | Admitting: *Deleted

## 2013-11-27 ENCOUNTER — Telehealth: Payer: Self-pay | Admitting: *Deleted

## 2013-11-27 MED ORDER — MECLIZINE HCL 25 MG PO TABS: 25.0000 mg | ORAL_TABLET | Freq: Three times a day (TID) | ORAL | Status: AC | PRN

## 2013-11-27 NOTE — Telephone Encounter (Signed)
I spoke with Katie Oneill and gave her the informatin in regards to the appointment with Dr. Constance Holster (ENT) on 12/03/13 @ 2:20pm.-eh

## 2013-11-27 NOTE — Telephone Encounter (Signed)
Katie Oneill called and asked that we call in [place] for her Dizziness. She said she is still waiting for a call from Dr. Amaryllis Dyke  Office for her appointment with him.-eh

## 2013-11-28 NOTE — Telephone Encounter (Signed)
Katie Oneill pt a courtesy call to check on her.  Advise her that if she is having headache she will need to see me in office and I would recommend starting a migraine medication but she needs to see me in office .   Advised if dizziness not better over weekend she needs to go to urgent care.

## 2013-11-28 NOTE — Telephone Encounter (Signed)
I spoke with Katie Oneill and set her up with Dr. Coralyn Mark on 12/04/13 to discuss migraine medication-eh

## 2013-12-04 ENCOUNTER — Encounter: Payer: Self-pay | Admitting: *Deleted

## 2013-12-04 ENCOUNTER — Ambulatory Visit (INDEPENDENT_AMBULATORY_CARE_PROVIDER_SITE_OTHER): Payer: No Typology Code available for payment source | Admitting: Internal Medicine

## 2013-12-04 ENCOUNTER — Encounter: Payer: Self-pay | Admitting: Internal Medicine

## 2013-12-04 VITALS — BP 119/72 | HR 61 | Temp 98.6°F | Resp 16 | Ht 62.0 in | Wt 141.0 lb

## 2013-12-04 DIAGNOSIS — R42 Dizziness and giddiness: Secondary | ICD-10-CM

## 2013-12-04 DIAGNOSIS — G43809 Other migraine, not intractable, without status migrainosus: Secondary | ICD-10-CM

## 2013-12-04 MED ORDER — NITROFURANTOIN MACROCRYSTAL 100 MG PO CAPS: 100.0000 mg | ORAL_CAPSULE | Freq: Two times a day (BID) | ORAL | Status: DC

## 2013-12-04 MED ORDER — RIZATRIPTAN BENZOATE 10 MG PO TBDP: 10.0000 mg | ORAL_TABLET | ORAL | Status: DC | PRN

## 2013-12-04 NOTE — Progress Notes (Signed)
Subjective:    Patient ID: Katie Oneill, female    DOB: [birth year], 53 y.o.   MRN: [id number]  HPI  Jasper is here for follow up  Dizziness :   She did see Dr. Constance Holster see scanned note.  Impression was vestibular migraine  Sed rate normal   Reports dizziness is better but still "does not feel quite rightl"  No headache pain no visual or speech changes  Allergies  Allergen Reactions  . Penicillins Hives  . Sulfa Antibiotics Hives   Past Medical History  Diagnosis Date  . Hypothyroid   . Asthma   . KGYJEHUD(149.7) 03/15/2013   Past Surgical History  Procedure Laterality Date  . Nasal sinus surgery      x2  . Strabismus surgery    . Bunionectomy Left   . Abdominal hysterectomy    . Tendon repair Right     hand  . Ercp N/A 06/19/2013    Procedure: ENDOSCOPIC RETROGRADE CHOLANGIOPANCREATOGRAPHY (ERCP);  Surgeon: Beryle Beams, MD;  Location: [facility];  Service: Endoscopy;  Laterality: N/A;  . Cholecystectomy N/A 06/20/2013    Procedure: LAPAROSCOPIC CHOLECYSTECTOMY WITH INTRAOPERATIVE CHOLANGIOGRAM;  Surgeon: Earnstine Regal, MD;  Location: [facility];  Service: General;  Laterality: N/A;  . Tonsillectomy  1967   History   Social History  . Marital Status: Married    Spouse Name: N/A    Number of Children: 3  . Years of Education: post grad   Occupational History  . [employer]   Social History Main Topics  . Smoking status: Never Smoker   . Smokeless tobacco: Never Used  . Alcohol Use: 0.5 oz/week    1 drink(s) per week     Comment: rarely  . Drug Use: No  . Sexual Activity: Yes   Other Topics Concern  . Not on file   Social History Narrative  . No narrative on file   Family History  Problem Relation Age of Onset  . Heart disease Mother   . Heart disease Father   . Melanoma Father   . Heart disease Maternal Grandmother   . Cancer Maternal Grandfather     brain cancer  . Brain cancer Maternal Grandfather   . Stroke  Paternal Grandfather    Patient Active Problem List   Diagnosis Date Noted  . Extrinsic asthma, unspecified 11/21/2013  . Anxiety state, unspecified 11/21/2013  . Allergic rhinitis 11/21/2013  . S/P hysterectomy  ovaries retained 11/21/2013  . Hypothyroidism  post thyroiditis per pt report 11/21/2013  . Cholelithiasis with choledocholithiasis 06/20/2013  . Choledocholithiasis 06/19/2013  . Abdominal pain 06/19/2013   Current Outpatient Prescriptions on File Prior to Visit  Medication Sig Dispense Refill  . acetaminophen (TYLENOL) 325 MG tablet Take 2 tablets (650 mg total) by mouth every 6 (six) hours as needed for mild pain or moderate pain.  30 tablet  1  . Alum Hydroxide-Mag Carbonate (GAVISCON EXTRA STRENGTH PO) Take 1 tablet by mouth as needed (stomach issues).      . Calcium Carbonate-Vitamin D (CALCIUM + D PO) Take 1 tablet by mouth daily.      . Cyanocobalamin (VITAMIN B-12 PO) Take 1 capsule by mouth daily.      . fluticasone (FLONASE) 50 MCG/ACT nasal spray Place 2 sprays into both nostrils daily.       Marland Kitchen levothyroxine (SYNTHROID, LEVOTHROID) 75 MCG tablet Take 75 mcg by mouth every other day.      Marland Kitchen  levothyroxine (SYNTHROID, LEVOTHROID) 88 MCG tablet Take 88 mcg by mouth daily before breakfast.      . loratadine (CLARITIN) 10 MG tablet Take 10 mg by mouth daily.      Marland Kitchen LORazepam (ATIVAN) 0.5 MG tablet Take 1 tablet (0.5 mg total) by mouth 2 (two) times daily as needed for anxiety (usually only uses for flight).  60 tablet  2  . meclizine (ANTIVERT) 25 MG tablet Take 1 tablet (25 mg total) by mouth 3 (three) times daily as needed for dizziness.  30 tablet  1  . mometasone-formoterol (DULERA) 100-5 MCG/ACT AERO Inhale 1 puff into the lungs 2 (two) times daily.      . Omega-3 Fatty Acids (FISH OIL PO) Take 1 capsule by mouth daily.      Penne Lash HFA 45 MCG/ACT inhaler Inhale 2 puffs into the lungs as needed (SOB).        No current facility-administered medications on file prior  to visit.      Review of Systems    see HPI Objective:   Physical Exam  Physical Exam  Nursing note and vitals reviewed.  Constitutional: She is oriented to person, place, and time. She appears well-developed and well-nourished.  HENT:  Head: Normocephalic and atraumatic.  Cardiovascular: Normal rate and regular rhythm. Exam reveals no gallop and no friction rub.  No murmur heard.  Pulmonary/Chest: Breath sounds normal. She has no wheezes. She has no rales.  Neurological: She is alert and oriented to person, place, and time.  Nonfocal exam  Skin: Skin is warm and dry.  Psychiatric: She has a normal mood and affect. Her behavior is normal.             Assessment & Plan:  Probable vestibular migraine  Will give Maxalt 10 mg here in office and repeat in 2 hours   She has upcoming appt with neurology   Call if not better.

## 2013-12-04 NOTE — Patient Instructions (Signed)
Take Maxalt as ordered   Cal if not improvement

## 2013-12-26 ENCOUNTER — Encounter: Payer: Self-pay | Admitting: Neurology

## 2013-12-26 ENCOUNTER — Ambulatory Visit (INDEPENDENT_AMBULATORY_CARE_PROVIDER_SITE_OTHER): Payer: No Typology Code available for payment source | Admitting: Neurology

## 2013-12-26 VITALS — BP 118/70 | HR 68 | Resp 14 | Ht 63.0 in | Wt 144.6 lb

## 2013-12-26 DIAGNOSIS — G43809 Other migraine, not intractable, without status migrainosus: Secondary | ICD-10-CM

## 2013-12-26 DIAGNOSIS — G43109 Migraine with aura, not intractable, without status migrainosus: Secondary | ICD-10-CM

## 2013-12-26 MED ORDER — ATENOLOL 25 MG PO TABS: 25.0000 mg | ORAL_TABLET | Freq: Every day | ORAL | Status: DC

## 2013-12-26 NOTE — Patient Instructions (Signed)
I think you probably are having migraine with brainstem aura (or vestibular migraine). 1.  To reduce the frequency of these attacks, we will start atenolol 25mg  every morning.  Side effects may include dizziness or lightheadedness, since it may lower blood pressure or heart rate.  Call in 4 weeks with update and we can increase dose if needed.  Call sooner if having any unwanted side effects. 2.  I wouldn't use the Maxalt or other triptan medication.  I would limit to over the counter medications such as Motrin or Excedrin Migraine.  Unfortunately, there really isn't any medications other than meclizine to help stop the vertigo itself, but hopefully the motrin or excedrin would help. 3.  Will provide sheet to help with sleep hygiene 4.  Follow up in 3 months.

## 2013-12-26 NOTE — Progress Notes (Signed)
NEUROLOGY CONSULTATION NOTE  Katie Oneill MRN: [id number] DOB: May 16, 1960  Referring provider: Dr. Coralyn Mark Primary care provider: Dr. Coralyn Mark  Reason for consult:  Headache and dizziness  HISTORY OF PRESENT ILLNESS: Katie Oneill is a 53 year old right-handed woman with history of hypothyroidism and asthma who presents for headache and vertigo.  Records reviewed.  In November 2014, she started experiencing episodes of headache associated with slowed processing, clumsiness of her right hand, difficulty getting words out and unsteady gait, possibly veering towards the right.  The headaches were described as bi-temporal aching or stabbing pain, anywhere from 3 to 7/10.  For a period of 6 weeks, she was getting them almost daily.  She reportedly had an MRI of the brain performed 02/13/13, which was unremarkable.  She was evaluated by Dr. Jannifer Franklin at [facility] in January.  She had an MRI of the brain without contrast performed on 02/28/13, which was reviewed and was normal.  Carotid duplex performed 03/28/13 revealed no hemodynamically significant stenosis.  MRA of the head was performed on 03/29/13, which was personally reviewed and revealed focal area of decreased signal in the left ICA, thought to be artifact.  Blood work was performed on 03/16/13, which showed ANA negative, Sed Rate 3, and ACE 47.  Symptoms had resolved and migraine was suspected. She was advised to start ASA 81mg  daily.  Starting in September 2015, she began experiencing similar episodes.  At that time, she woke up one morning with headache, spinning sensation and nausea.  She fell back to sleep and when she woke up 2 hours later, it had resolved.  About 4 weeks ago, she developed another episode of dizziness, associated with headache. She also reported slight clumsiness of the right hand.  During an episode, it is more difficult to write.  It lasted 5 days.  She takes Motrin, which helps the headache quickly,  but does not help the dizziness.  About 2 weeks ago, she was given Maxalt.  She felt that she was about to have a headache and dizzy spell. It seemed to prevent the onset of another episode.  Last month, she was evaluated for dizziness by Dr. Constance Holster, ENT.  She was experiencing vertigo and imbalance with aural fullness and possible hearing loss.  Exam was normal.   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   Personal history of headache:  No but when she was 14 or 15, she was experiencing similar episodes of dizziness. Family history of headache:  no  PAST MEDICAL HISTORY: Past Medical History  Diagnosis Date  . Hypothyroid   . Asthma   . Headache(784.0) 03/15/2013    PAST SURGICAL HISTORY: Past Surgical History  Procedure Laterality Date  . Nasal sinus surgery      x2  . Strabismus surgery    . Bunionectomy Left   . Abdominal hysterectomy    . Tendon repair Right     hand  . Ercp N/A 06/19/2013    Procedure: ENDOSCOPIC RETROGRADE CHOLANGIOPANCREATOGRAPHY (ERCP);  Surgeon: Beryle Beams, MD;  Location: [facility];  Service: Endoscopy;  Laterality: N/A;  . Cholecystectomy N/A 06/20/2013    Procedure: LAPAROSCOPIC CHOLECYSTECTOMY WITH INTRAOPERATIVE CHOLANGIOGRAM;  Surgeon: Earnstine Regal, MD;  Location: [facility];  Service: General;  Laterality: N/A;  . Tonsillectomy  1967    MEDICATIONS: Current Outpatient Prescriptions on File Prior to Visit  Medication Sig Dispense Refill  . acetaminophen (TYLENOL) 325 MG tablet Take 2 tablets (  650 mg total) by mouth every 6 (six) hours as needed for mild pain or moderate pain. 30 tablet 1  . fluticasone (FLONASE) 50 MCG/ACT nasal spray Place 2 sprays into both nostrils daily.     Marland Kitchen levothyroxine (SYNTHROID, LEVOTHROID) 75 MCG tablet Take 75 mcg by mouth every other day.    . levothyroxine (SYNTHROID, LEVOTHROID) 88 MCG tablet Take 88 mcg by mouth daily before breakfast.    . loratadine (CLARITIN) 10 MG  tablet Take 10 mg by mouth daily.    Marland Kitchen LORazepam (ATIVAN) 0.5 MG tablet Take 1 tablet (0.5 mg total) by mouth 2 (two) times daily as needed for anxiety (usually only uses for flight). 60 tablet 2  . meclizine (ANTIVERT) 25 MG tablet Take 1 tablet (25 mg total) by mouth 3 (three) times daily as needed for dizziness. 30 tablet 1  . mometasone-formoterol (DULERA) 100-5 MCG/ACT AERO Inhale 1 puff into the lungs 2 (two) times daily.    . nitrofurantoin (MACRODANTIN) 100 MG capsule Take 1 capsule (100 mg total) by mouth 2 (two) times daily. 14 capsule 0  . rizatriptan (MAXALT-MLT) 10 MG disintegrating tablet Take 1 tablet (10 mg total) by mouth as needed for migraine. May repeat in 2 hours if needed 10 tablet 0  . XOPENEX HFA 45 MCG/ACT inhaler Inhale 2 puffs into the lungs as needed (SOB).      No current facility-administered medications on file prior to visit.    ALLERGIES: Allergies  Allergen Reactions  . Penicillins Hives  . Sulfa Antibiotics Hives    FAMILY HISTORY: Family History  Problem Relation Age of Onset  . Heart disease Mother   . Heart disease Father   . Melanoma Father   . Heart disease Maternal Grandmother   . Cancer Maternal Grandfather     brain cancer  . Stroke Paternal Grandfather   . Cancer Paternal Grandmother     Breast cancer     SOCIAL HISTORY: History   Social History  . Marital Status: Married    Spouse Name: N/A    Number of Children: 3  . Years of Education: post grad   Occupational History  . [employer]   Social History Main Topics  . Smoking status: Never Smoker   . Smokeless tobacco: Never Used  . Alcohol Use: 0.6 oz/week    1 Not specified per week     Comment: rarely  . Drug Use: No  . Sexual Activity:    Partners: Male   Other Topics Concern  . Not on file   Social History Narrative    REVIEW OF SYSTEMS: Constitutional: No fevers, chills, or sweats, no generalized fatigue, change in  appetite Eyes: No visual changes, double vision, eye pain Ear, nose and throat: No hearing loss, ear pain, nasal congestion, sore throat Cardiovascular: No chest pain, palpitations Respiratory:  No shortness of breath at rest or with exertion, wheezes GastrointestinaI: No nausea, vomiting, diarrhea, abdominal pain, fecal incontinence Genitourinary:  No dysuria, urinary retention or frequency Musculoskeletal:  No neck pain, back pain Integumentary: No rash, pruritus, skin lesions Neurological: as above Psychiatric: No depression, insomnia, anxiety Endocrine: No palpitations, fatigue, diaphoresis, mood swings, change in appetite, change in weight, increased thirst Hematologic/Lymphatic:  No anemia, purpura, petechiae. Allergic/Immunologic: no itchy/runny eyes, nasal congestion, recent allergic reactions, rashes  PHYSICAL EXAM: Filed Vitals:   12/26/13 1446  BP: 118/70  Pulse: 68  Resp: 14   General: No acute distress Head:  Normocephalic/atraumatic Neck: supple, no paraspinal tenderness, full range of motion Back: No paraspinal tenderness Heart: regular rate and rhythm Lungs: Clear to auscultation bilaterally. Vascular: No carotid bruits. Neurological Exam: Mental status: alert and oriented to person, place, and time, recent and remote memory intact, fund of knowledge intact, attention and concentration intact, speech fluent and not dysarthric, language intact. Cranial nerves: CN I: not tested CN II: pupils equal, round and reactive to light, visual fields intact, fundi unremarkable, without vessel changes, exudates, hemorrhages or papilledema. CN III, IV, VI:  full range of motion, no nystagmus, no ptosis CN V: facial sensation intact CN VII: upper and lower face symmetric CN VIII: hearing intact CN IX, X: gag intact, uvula midline CN XI: sternocleidomastoid and trapezius muscles intact CN XII: tongue midline Bulk & Tone: normal, no fasciculations. Motor:  5/5  throughout Sensation: temperature and vibration intact Deep Tendon Reflexes: 2+ throughout Finger to nose testing:  No dysmetria Heel to shin: no dysmetria Gait: normal station and stride.  Able to turn and walk in tandem. Romberg negative.  IMPRESSION: Migraine with brainstem aura  PLAN: 1.  Unfortunately, it will be difficult to treat the vertigo attacks acutely, so we will initiate preventative therapy.  Start atenolol 25mg  daily. 2.  For abortive therapy, OTC meds, such as Motrin, Excedrin Migraine or Tylenol.  Unfortunately, it may not help the dizzy symptoms as well.  I would not use Maxalt or other triptan, given the brainstem symptoms. 3.  Sleep hygiene 4.  Follow up in 3 months.  Call in 4 weeks with update.  Thank you for allowing me to take part in the care of this patient.  Metta Clines, DO  CC:  Emi Belfast, MD

## 2014-03-28 ENCOUNTER — Ambulatory Visit (INDEPENDENT_AMBULATORY_CARE_PROVIDER_SITE_OTHER): Payer: 59 | Admitting: Neurology

## 2014-03-28 ENCOUNTER — Encounter: Payer: Self-pay | Admitting: Neurology

## 2014-03-28 VITALS — BP 104/70 | HR 70 | Temp 98.0°F | Resp 16 | Ht 63.0 in | Wt 143.4 lb

## 2014-03-28 DIAGNOSIS — G43109 Migraine with aura, not intractable, without status migrainosus: Secondary | ICD-10-CM

## 2014-03-28 DIAGNOSIS — G43809 Other migraine, not intractable, without status migrainosus: Secondary | ICD-10-CM

## 2014-03-28 MED ORDER — ATENOLOL 50 MG PO TABS: 50.0000 mg | ORAL_TABLET | Freq: Every day | ORAL | Status: DC

## 2014-03-28 NOTE — Patient Instructions (Signed)
1.  Start atenolol 50mg  daily.  Watch out for lightheadedness.  Otherwise call in 4 weeks with update 2.  Limit use of over the counter pain relievers to no more than 2 days out of the week 3.  Follow up in 3 months

## 2014-03-28 NOTE — Progress Notes (Signed)
NEUROLOGY FOLLOW UP OFFICE NOTE  Katie Oneill [id number]  HISTORY OF PRESENT ILLNESS: Katie Oneill is a 54 year old right-handed woman with history of hypothyroidism and asthma who follows up for migraine with brainstem aura.  UPDATE: She was stared on atenolol 25mg  daily but has only taken it for the past month.  She was advised to stop Maxalt as it would likely not be effective for the dizzy spells.  She has had about 3 episodes of dizziness since last visit but one severe episode of vertigo around Christmas.  She reports a headache a couple of days prior to the incident.  She has a dull headache about twice a week.  She takes Excedrin .  HISTORY: In November 2014, she started experiencing episodes of headache associated with slowed processing, clumsiness of her right hand, difficulty getting words out and unsteady gait, possibly veering towards the right.  The headaches were described as bi-temporal aching or stabbing pain, anywhere from 3 to 7/10.  For a period of 6 weeks, she was getting them almost daily.  She reportedly had an MRI of the brain performed 02/13/13, which was unremarkable.  She was evaluated by Dr. Jannifer Franklin at [facility] in January.  She had an MRI of the brain without contrast performed on 02/28/13, which was reviewed and was normal.  Carotid duplex performed 03/28/13 revealed no hemodynamically significant stenosis.  MRA of the head was performed on 03/29/13, which was personally reviewed and revealed focal area of decreased signal in the left ICA, thought to be artifact.  Blood work was performed on 03/16/13, which showed ANA negative, Sed Rate 3, and ACE 47.  Symptoms had resolved and migraine was suspected. She was advised to start ASA 81mg  daily.  Starting in September 2015, she began experiencing similar episodes.  At that time, she woke up one morning with headache, spinning sensation and nausea.  She fell back to sleep and when she woke up 2 hours later, it  had resolved.  About 4 weeks ago, she developed another episode of dizziness, associated with headache. She also reported slight clumsiness of the right hand.  During an episode, it is more difficult to write.  It lasted 5 days.  She takes Motrin, which helps the headache quickly, but does not help the dizziness.  About 2 weeks ago, she was given Maxalt.  She felt that she was about to have a headache and dizzy spell. It seemed to prevent the onset of another episode.  Last month, she was evaluated for dizziness by Dr. Constance Holster, ENT.  She was experiencing vertigo and imbalance with aural fullness and possible hearing loss.  Exam was normal.   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    Personal history of headache:  No but when she was 14 or 15, she was experiencing similar episodes of dizziness. Family history of headache:  no  PAST MEDICAL HISTORY: Past Medical History  Diagnosis Date  . Hypothyroid   . Asthma   . Headache(784.0) 03/15/2013    MEDICATIONS: Current Outpatient Prescriptions on File Prior to Visit  Medication Sig Dispense Refill  . acetaminophen (TYLENOL) 325 MG tablet Take 2 tablets (650 mg total) by mouth every 6 (six) hours as needed for mild pain or moderate pain. 30 tablet 1  . fluticasone (FLONASE) 50 MCG/ACT nasal spray Place 2 sprays into both nostrils daily.     Marland Kitchen levothyroxine (SYNTHROID, LEVOTHROID) 75 MCG tablet Take 75 mcg  by mouth every other day.    . levothyroxine (SYNTHROID, LEVOTHROID) 88 MCG tablet Take 88 mcg by mouth daily before breakfast.    . loratadine (CLARITIN) 10 MG tablet Take 10 mg by mouth daily.    Marland Kitchen LORazepam (ATIVAN) 0.5 MG tablet Take 1 tablet (0.5 mg total) by mouth 2 (two) times daily as needed for anxiety (usually only uses for flight). 60 tablet 2  . meclizine (ANTIVERT) 25 MG tablet Take 1 tablet (25 mg total) by mouth 3 (three) times daily as needed for dizziness. 30 tablet 1  .  mometasone-formoterol (DULERA) 100-5 MCG/ACT AERO Inhale 1 puff into the lungs 2 (two) times daily.    Penne Lash HFA 45 MCG/ACT inhaler Inhale 2 puffs into the lungs as needed (SOB).     . nitrofurantoin (MACRODANTIN) 100 MG capsule Take 1 capsule (100 mg total) by mouth 2 (two) times daily. (Patient not taking: Reported on 03/28/2014) 14 capsule 0  . rizatriptan (MAXALT-MLT) 10 MG disintegrating tablet Take 1 tablet (10 mg total) by mouth as needed for migraine. May repeat in 2 hours if needed (Patient not taking: Reported on 03/28/2014) 10 tablet 0   No current facility-administered medications on file prior to visit.    ALLERGIES: Allergies  Allergen Reactions  . Penicillins Hives  . Sulfa Antibiotics Hives    FAMILY HISTORY: Family History  Problem Relation Age of Onset  . Heart disease Mother   . Heart disease Father   . Melanoma Father   . Heart disease Maternal Grandmother   . Cancer Maternal Grandfather     brain cancer  . Stroke Paternal Grandfather   . Cancer Paternal Grandmother     Breast cancer     SOCIAL HISTORY: History   Social History  . Marital Status: Married    Spouse Name: N/A    Number of Children: 3  . Years of Education: post grad   Occupational History  . [employer]   Social History Main Topics  . Smoking status: Never Smoker   . Smokeless tobacco: Never Used  . Alcohol Use: 0.6 oz/week    1 Not specified per week     Comment: rarely  . Drug Use: No  . Sexual Activity: No   Other Topics Concern  . Not on file   Social History Narrative    REVIEW OF SYSTEMS: Constitutional: No fevers, chills, or sweats, no generalized fatigue, change in appetite Eyes: No visual changes, double vision, eye pain Ear, nose and throat: No hearing loss, ear pain, nasal congestion, sore throat Cardiovascular: No chest pain, palpitations Respiratory:  No shortness of breath at rest or with exertion,  wheezes GastrointestinaI: No nausea, vomiting, diarrhea, abdominal pain, fecal incontinence Genitourinary:  No dysuria, urinary retention or frequency Musculoskeletal:  No neck pain, back pain Integumentary: No rash, pruritus, skin lesions Neurological: as above Psychiatric: No depression, insomnia, anxiety Endocrine: No palpitations, fatigue, diaphoresis, mood swings, change in appetite, change in weight, increased thirst Hematologic/Lymphatic:  No anemia, purpura, petechiae. Allergic/Immunologic: no itchy/runny eyes, nasal congestion, recent allergic reactions, rashes  PHYSICAL EXAM: Filed Vitals:   03/28/14 0756  BP: 104/70  Pulse: 70  Temp: 98 F (36.7 C)  Resp: 16   General: No acute distress Head:  Normocephalic/atraumatic   IMPRESSION: Possible migraine with brainstem aura  PLAN: 1.  Start atenolol 50mg  daily.  Her blood pressure is a little on the low side, so she is to watch out  for lightheadedness.  I wouldn't probably go up more than 50mg .  If not effective, would switch to topiramate. 2.  Limit use of over the counter pain relievers to no more than 2 days out of the week 3.  Follow up in 3 months  15 minutes spent with patient, 100% spent discussing management.  Metta Clines, DO  CC:  Emi Belfast, MD

## 2014-04-10 ENCOUNTER — Encounter: Payer: Self-pay | Admitting: *Deleted

## 2014-04-24 ENCOUNTER — Other Ambulatory Visit: Payer: Self-pay | Admitting: *Deleted

## 2014-04-24 ENCOUNTER — Telehealth: Payer: Self-pay | Admitting: Neurology

## 2014-04-24 DIAGNOSIS — Z8669 Personal history of other diseases of the nervous system and sense organs: Secondary | ICD-10-CM

## 2014-04-24 MED ORDER — ATENOLOL 50 MG PO TABS: 50.0000 mg | ORAL_TABLET | Freq: Every day | ORAL | Status: DC

## 2014-04-24 MED ORDER — LORAZEPAM 0.5 MG PO TABS: 0.5000 mg | ORAL_TABLET | Freq: Two times a day (BID) | ORAL | Status: AC | PRN

## 2014-04-24 NOTE — Telephone Encounter (Signed)
RX called in .

## 2014-04-24 NOTE — Telephone Encounter (Signed)
Atenolol 50 mg was e scribed to pharmacy

## 2014-04-24 NOTE — Telephone Encounter (Signed)
Pt called requesting a refill for ATENOLOL 50mg  Pharmacy: [facility] in [place] C/b [phone]

## 2014-04-24 NOTE — Telephone Encounter (Signed)
Refill request

## 2014-05-31 ENCOUNTER — Other Ambulatory Visit: Payer: Self-pay | Admitting: *Deleted

## 2014-05-31 ENCOUNTER — Telehealth: Payer: Self-pay | Admitting: Neurology

## 2014-05-31 DIAGNOSIS — Z8669 Personal history of other diseases of the nervous system and sense organs: Secondary | ICD-10-CM

## 2014-05-31 MED ORDER — ATENOLOL 50 MG PO TABS: 50.0000 mg | ORAL_TABLET | Freq: Every day | ORAL | Status: DC

## 2014-05-31 NOTE — Telephone Encounter (Signed)
Atenolol sent to pharmacy 50 mg # 30 with 3 refills

## 2014-05-31 NOTE — Telephone Encounter (Signed)
Pt called requesting a refill for ATENOLOL 50mg   Pharmacy: [facility] [address]  C/b [phone]

## 2014-06-03 DIAGNOSIS — R490 Dysphonia: Secondary | ICD-10-CM | POA: Insufficient documentation

## 2014-07-02 ENCOUNTER — Ambulatory Visit: Payer: 59 | Admitting: Neurology

## 2014-07-31 ENCOUNTER — Encounter: Payer: Self-pay | Admitting: Neurology

## 2014-07-31 ENCOUNTER — Ambulatory Visit (INDEPENDENT_AMBULATORY_CARE_PROVIDER_SITE_OTHER): Payer: 59 | Admitting: Neurology

## 2014-07-31 VITALS — BP 110/60 | HR 57 | Ht 62.0 in | Wt 146.5 lb

## 2014-07-31 DIAGNOSIS — G43809 Other migraine, not intractable, without status migrainosus: Secondary | ICD-10-CM

## 2014-07-31 DIAGNOSIS — G43109 Migraine with aura, not intractable, without status migrainosus: Secondary | ICD-10-CM | POA: Diagnosis not present

## 2014-07-31 NOTE — Progress Notes (Signed)
NEUROLOGY FOLLOW UP OFFICE NOTE  Katie Oneill [id number]  HISTORY OF PRESENT ILLNESS: Katie Oneill is a 54 year old right-handed woman with history of hypothyroidism and asthma who follows up for migraine with brainstem aura.  UPDATE: She is taking atenolol 50mg  daily.  She is doing well.  She has had no dizzy spells.  She gets a slight headache once or twice a week, but easily manageable with Motrin.  She denies lightheadedness.  HISTORY: In November 2014, she started experiencing episodes of headache associated with slowed processing, clumsiness of her right hand, difficulty getting words out and unsteady gait, possibly veering towards the right.  The headaches were described as bi-temporal aching or stabbing pain, anywhere from 3 to 7/10.  For a period of 6 weeks, she was getting them almost daily.  She reportedly had an MRI of the brain performed 02/13/13, which was unremarkable.  She was evaluated by Dr. Jannifer Franklin at [facility] in January.  She had an MRI of the brain without contrast performed on 02/28/13, which was reviewed and was normal.  Carotid duplex performed 03/28/13 revealed no hemodynamically significant stenosis.  MRA of the head was performed on 03/29/13, which was personally reviewed and revealed focal area of decreased signal in the left ICA, thought to be artifact.  Blood work was performed on 03/16/13, which showed ANA negative, Sed Rate 3, and ACE 47.  Symptoms had resolved and migraine was suspected. She was advised to start ASA 81mg  daily.  Starting in September 2015, she began experiencing similar episodes.  At that time, she woke up one morning with headache, spinning sensation and nausea.  She fell back to sleep and when she woke up 2 hours later, it had resolved.  About 4 weeks ago, she developed another episode of dizziness, associated with headache. She also reported slight clumsiness of the right hand.  During an episode, it is more difficult to write.  It  lasted 5 days.    She was evaluated for dizziness by Dr. Constance Holster, ENT.  She was experiencing vertigo and imbalance with aural fullness and possible hearing loss.  Exam was normal.   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    Personal history of headache:  No but when she was 14 or 15, she was experiencing similar episodes of dizziness. Family history of headache:  no  PAST MEDICAL HISTORY: Past Medical History  Diagnosis Date  . Hypothyroid   . Asthma   . Headache(784.0) 03/15/2013    MEDICATIONS: Current Outpatient Prescriptions on File Prior to Visit  Medication Sig Dispense Refill  . acetaminophen (TYLENOL) 325 MG tablet Take 2 tablets (650 mg total) by mouth every 6 (six) hours as needed for mild pain or moderate pain. 30 tablet 1  . atenolol (TENORMIN) 50 MG tablet Take 1 tablet (50 mg total) by mouth daily. 30 tablet 3  . atenolol (TENORMIN) 50 MG tablet Take 1 tablet (50 mg total) by mouth daily. 30 tablet 2  . fluticasone (FLONASE) 50 MCG/ACT nasal spray Place 2 sprays into both nostrils daily.     Marland Kitchen levothyroxine (SYNTHROID, LEVOTHROID) 75 MCG tablet Take 75 mcg by mouth every other day.    . levothyroxine (SYNTHROID, LEVOTHROID) 88 MCG tablet Take 88 mcg by mouth daily before breakfast.    . loratadine (CLARITIN) 10 MG tablet Take 10 mg by mouth daily.    Marland Kitchen LORazepam (ATIVAN) 0.5 MG tablet Take 1 tablet (0.5 mg total)  by mouth 2 (two) times daily as needed for anxiety (usually only uses for flight). 15 tablet 1  . meclizine (ANTIVERT) 25 MG tablet Take 1 tablet (25 mg total) by mouth 3 (three) times daily as needed for dizziness. 30 tablet 1  . mometasone-formoterol (DULERA) 100-5 MCG/ACT AERO Inhale 1 puff into the lungs 2 (two) times daily.    . nitrofurantoin (MACRODANTIN) 100 MG capsule Take 1 capsule (100 mg total) by mouth 2 (two) times daily. 14 capsule 0  . pantoprazole (PROTONIX) 40 MG tablet Take 40 mg by mouth daily.    .  rizatriptan (MAXALT-MLT) 10 MG disintegrating tablet Take 1 tablet (10 mg total) by mouth as needed for migraine. May repeat in 2 hours if needed 10 tablet 0  . XOPENEX HFA 45 MCG/ACT inhaler Inhale 2 puffs into the lungs as needed (SOB).      No current facility-administered medications on file prior to visit.    ALLERGIES: Allergies  Allergen Reactions  . Penicillins Hives  . Sulfa Antibiotics Hives    FAMILY HISTORY: Family History  Problem Relation Age of Onset  . Heart disease Mother   . Heart disease Father   . Melanoma Father   . Heart disease Maternal Grandmother   . Cancer Maternal Grandfather     brain cancer  . Stroke Paternal Grandfather   . Cancer Paternal Grandmother     Breast cancer     SOCIAL HISTORY: History   Social History  . Marital Status: Married    Spouse Name: N/A  . Number of Children: 3  . Years of Education: post grad   Occupational History  . [employer]   Social History Main Topics  . Smoking status: Never Smoker   . Smokeless tobacco: Never Used  . Alcohol Use: 0.6 oz/week    1 Standard drinks or equivalent per week     Comment: rarely  . Drug Use: No  . Sexual Activity: No   Other Topics Concern  . Not on file   Social History Narrative    REVIEW OF SYSTEMS: Constitutional: No fevers, chills, or sweats, no generalized fatigue, change in appetite Eyes: No visual changes, double vision, eye pain Ear, nose and throat: No hearing loss, ear pain, nasal congestion, sore throat Cardiovascular: No chest pain, palpitations Respiratory:  No shortness of breath at rest or with exertion, wheezes GastrointestinaI: No nausea, vomiting, diarrhea, abdominal pain, fecal incontinence Genitourinary:  No dysuria, urinary retention or frequency Musculoskeletal:  No neck pain, back pain Integumentary: No rash, pruritus, skin lesions Neurological: as above Psychiatric: No depression, insomnia,  anxiety Endocrine: No palpitations, fatigue, diaphoresis, mood swings, change in appetite, change in weight, increased thirst Hematologic/Lymphatic:  No anemia, purpura, petechiae. Allergic/Immunologic: no itchy/runny eyes, nasal congestion, recent allergic reactions, rashes  PHYSICAL EXAM: Filed Vitals:   07/31/14 1009  BP: 110/60  Pulse: 57   General: No acute distress Head:  Normocephalic/atraumatic Eyes:  Fundoscopic exam unremarkable without vessel changes, exudates, hemorrhages or papilledema. Neck: supple, no paraspinal tenderness, full range of motion Heart:  Regular rate and rhythm Lungs:  Clear to auscultation bilaterally Back: No paraspinal tenderness Neurological Exam: alert and oriented to person, place, and time. Attention span and concentration intact, recent and remote memory intact, fund of knowledge intact.  Speech fluent and not dysarthric, language intact.  CN II-XII intact. Fundoscopic exam unremarkable without vessel changes, exudates, hemorrhages or papilledema.  Bulk and tone normal, muscle strength 5/5  throughout.  Sensation to light touch, temperature and vibration intact.  Deep tendon reflexes 2+ throughout, toes downgoing.  Finger to nose and heel to shin testing intact.  Gait normal, Romberg negative.  IMPRESSION: Vestibular migraine  PLAN: Continue atenolol 50mg  daily Follow up in 6 months, call with questions or concerns.  15 minutes spent face to face with patient, over 50% spent discussing plan  Metta Clines, DO  CC: Emi Belfast, MD

## 2014-07-31 NOTE — Patient Instructions (Signed)
Continue atenolol 50mg  daily Follow up in 6 months.

## 2014-08-05 ENCOUNTER — Other Ambulatory Visit: Payer: Self-pay | Admitting: *Deleted

## 2014-08-05 MED ORDER — ATENOLOL 50 MG PO TABS: 50.0000 mg | ORAL_TABLET | Freq: Every day | ORAL | Status: DC

## 2014-11-06 ENCOUNTER — Ambulatory Visit (INDEPENDENT_AMBULATORY_CARE_PROVIDER_SITE_OTHER): Payer: 59 | Admitting: Neurology

## 2014-11-06 ENCOUNTER — Encounter: Payer: Self-pay | Admitting: Neurology

## 2014-11-06 VITALS — BP 100/62 | HR 59 | Temp 98.2°F | Resp 16 | Ht 62.0 in | Wt 144.9 lb

## 2014-11-06 DIAGNOSIS — G43109 Migraine with aura, not intractable, without status migrainosus: Secondary | ICD-10-CM

## 2014-11-06 DIAGNOSIS — G4485 Primary stabbing headache: Secondary | ICD-10-CM | POA: Diagnosis not present

## 2014-11-06 DIAGNOSIS — G43809 Other migraine, not intractable, without status migrainosus: Secondary | ICD-10-CM | POA: Insufficient documentation

## 2014-11-06 MED ORDER — GABAPENTIN 300 MG PO CAPS: 300.0000 mg | ORAL_CAPSULE | Freq: Every day | ORAL | Status: DC

## 2014-11-06 NOTE — Progress Notes (Signed)
NEUROLOGY FOLLOW UP OFFICE NOTE  Katie Oneill [id number]  HISTORY OF PRESENT ILLNESS: Katie Oneill is a 54 year old right-handed woman with history of hypothyroidism and asthma who follows up for migraine with brainstem aura.  UPDATE: She is taking atenolol 50mg  daily.  Last visit in June, she was doing well.  However, since starting school again, she had increase in the stabbing headache, although not the dizzy spells.  She had one bad week over the past month.  They respond to Motrin.  Her allergist would like to start her on allergy shots but is hesitant since she is on atenolol.  HISTORY: In November 2014, she started experiencing episodes of headache associated with slowed processing, clumsiness of her right hand, difficulty getting words out and unsteady gait, possibly veering towards the right.  The headaches were described as bi-temporal aching or stabbing pain, anywhere from 3 to 7/10.  For a period of 6 weeks, she was getting them almost daily.  She reportedly had an MRI of the brain performed 02/13/13, which was unremarkable.  She was evaluated by Dr. Jannifer Franklin at [facility] in January.  She had an MRI of the brain without contrast performed on 02/28/13, which was reviewed and was normal.  Carotid duplex performed 03/28/13 revealed no hemodynamically significant stenosis.  MRA of the head was performed on 03/29/13, which was personally reviewed and revealed focal area of decreased signal in the left ICA, thought to be artifact.  Blood work was performed on 03/16/13, which showed ANA negative, Sed Rate 3, and ACE 47.  Symptoms had resolved and migraine was suspected. She was advised to start ASA 81mg  daily.  Starting in September 2015, she began experiencing similar episodes.  At that time, she woke up one morning with headache, spinning sensation and nausea.  She fell back to sleep and when she woke up 2 hours later, it had resolved.  About 4 weeks ago, she developed another  episode of dizziness, associated with headache. She also reported slight clumsiness of the right hand.  During an episode, it is more difficult to write.  It lasted 5 days.    She was evaluated for dizziness by Dr. Constance Holster, ENT.  She was experiencing vertigo and imbalance with aural fullness and possible hearing loss.  Exam was normal.   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    Personal history of headache:  No but when she was 14 or 15, she was experiencing similar episodes of dizziness. Family history of headache:  no  PAST MEDICAL HISTORY: Past Medical History  Diagnosis Date  . Hypothyroid   . Asthma   . Headache(784.0) 03/15/2013    MEDICATIONS: Current Outpatient Prescriptions on File Prior to Visit  Medication Sig Dispense Refill  . acetaminophen (TYLENOL) 325 MG tablet Take 2 tablets (650 mg total) by mouth every 6 (six) hours as needed for mild pain or moderate pain. 30 tablet 1  . fluticasone (FLONASE) 50 MCG/ACT nasal spray Place 2 sprays into both nostrils daily.     Marland Kitchen levothyroxine (SYNTHROID, LEVOTHROID) 75 MCG tablet Take 75 mcg by mouth every other day.    . levothyroxine (SYNTHROID, LEVOTHROID) 88 MCG tablet Take 88 mcg by mouth daily before breakfast.    . LORazepam (ATIVAN) 0.5 MG tablet Take 1 tablet (0.5 mg total) by mouth 2 (two) times daily as needed for anxiety (usually only uses for flight). 15 tablet 1  . meclizine (ANTIVERT) 25 MG  tablet Take 1 tablet (25 mg total) by mouth 3 (three) times daily as needed for dizziness. 30 tablet 1  . XOPENEX HFA 45 MCG/ACT inhaler Inhale 2 puffs into the lungs as needed (SOB).      No current facility-administered medications on file prior to visit.    ALLERGIES: Allergies  Allergen Reactions  . Penicillins Hives  . Sulfa Antibiotics Hives    FAMILY HISTORY: Family History  Problem Relation Age of Onset  . Heart disease Mother   . Heart disease Father   . Melanoma  Father   . Heart disease Maternal Grandmother   . Cancer Maternal Grandfather     brain cancer  . Stroke Paternal Grandfather   . Cancer Paternal Grandmother     Breast cancer     SOCIAL HISTORY: Social History   Social History  . Marital Status: Married    Spouse Name: N/A  . Number of Children: 3  . Years of Education: post grad   Occupational History  . [employer]   Social History Main Topics  . Smoking status: Never Smoker   . Smokeless tobacco: Never Used  . Alcohol Use: 0.6 oz/week    1 Standard drinks or equivalent per week     Comment: rarely  . Drug Use: No  . Sexual Activity: No   Other Topics Concern  . Not on file   Social History Narrative    REVIEW OF SYSTEMS: Constitutional: No fevers, chills, or sweats, no generalized fatigue, change in appetite Eyes: No visual changes, double vision, eye pain Ear, nose and throat: No hearing loss, ear pain, nasal congestion, sore throat Cardiovascular: No chest pain, palpitations Respiratory:  No shortness of breath at rest or with exertion, wheezes GastrointestinaI: No nausea, vomiting, diarrhea, abdominal pain, fecal incontinence Genitourinary:  No dysuria, urinary retention or frequency Musculoskeletal:  No neck pain, back pain Integumentary: No rash, pruritus, skin lesions Neurological: as above Psychiatric: No depression, insomnia, anxiety Endocrine: No palpitations, fatigue, diaphoresis, mood swings, change in appetite, change in weight, increased thirst Hematologic/Lymphatic:  No anemia, purpura, petechiae. Allergic/Immunologic: no itchy/runny eyes, nasal congestion, recent allergic reactions, rashes  PHYSICAL EXAM: Filed Vitals:   11/06/14 0933  BP: 100/62  Pulse: 59  Temp: 98.2 F (36.8 C)  Resp: 16   General: No acute distress.  Patient appears well-groomed.  Head:  Normocephalic/atraumatic Eyes:  Fundoscopic exam unremarkable without vessel changes,  exudates, hemorrhages or papilledema. Neck: supple, no paraspinal tenderness, full range of motion Heart:  Regular rate and rhythm Lungs:  Clear to auscultation bilaterally Back: No paraspinal tenderness Neurological Exam: alert and oriented to person, place, and time. Attention span and concentration intact, recent and remote memory intact, fund of knowledge intact.  Speech fluent and not dysarthric, language intact.  CN II-XII intact. Fundoscopic exam unremarkable without vessel changes, exudates, hemorrhages or papilledema.  Bulk and tone normal, muscle strength 5/5 throughout.  Sensation to light touch, temperature and vibration intact.  Deep tendon reflexes 2+ throughout, toes downgoing.  Finger to nose and heel to shin testing intact.  Gait normal.  IMPRESSION: Vestibular migraine Stabbing headache  PLAN: 1.  Start gabapentin 300mg  at night.  She will call in 4 weeks with update 2.  Stop atenolol 3.  Motrin as needed 4.  Follow up in 3 months.  15 minutes spent face to face with patient, over 50% spent discussing management.  Metta Clines, DO

## 2014-11-06 NOTE — Patient Instructions (Signed)
1.  Stop the atenolol 2.  Start gabapentin 300mg  at bedtime.  Call in 4 weeks with update. 3.  Follow up in 3 months

## 2015-01-25 ENCOUNTER — Other Ambulatory Visit: Payer: Self-pay | Admitting: Neurology

## 2015-01-27 NOTE — Telephone Encounter (Signed)
Last OV: 01/31/15 Next OV: 11/06/14

## 2015-01-31 ENCOUNTER — Encounter: Payer: Self-pay | Admitting: Neurology

## 2015-01-31 ENCOUNTER — Ambulatory Visit (INDEPENDENT_AMBULATORY_CARE_PROVIDER_SITE_OTHER): Payer: 59 | Admitting: Neurology

## 2015-01-31 VITALS — BP 128/68 | HR 76 | Ht 62.0 in | Wt 139.0 lb

## 2015-01-31 DIAGNOSIS — G4485 Primary stabbing headache: Secondary | ICD-10-CM | POA: Diagnosis not present

## 2015-01-31 DIAGNOSIS — M792 Neuralgia and neuritis, unspecified: Secondary | ICD-10-CM

## 2015-01-31 DIAGNOSIS — G5792 Unspecified mononeuropathy of left lower limb: Secondary | ICD-10-CM

## 2015-01-31 DIAGNOSIS — G43109 Migraine with aura, not intractable, without status migrainosus: Secondary | ICD-10-CM | POA: Diagnosis not present

## 2015-01-31 DIAGNOSIS — G43809 Other migraine, not intractable, without status migrainosus: Secondary | ICD-10-CM

## 2015-01-31 NOTE — Progress Notes (Signed)
Chart forwarded.  

## 2015-01-31 NOTE — Patient Instructions (Signed)
1.  Continue gabapentin 300mg  at bedtime 2.  Follow up in 6 months

## 2015-01-31 NOTE — Progress Notes (Signed)
NEUROLOGY FOLLOW UP OFFICE NOTE  Katie Oneill [id number]  HISTORY OF PRESENT ILLNESS: Katie Oneill is a 54 year old right-handed woman with history of hypothyroidism and asthma who follows up for migraine with brainstem aura and stabbing headache.  UPDATE: Since starting school again, she had increase in the stabbing headache, although not the dizzy spells.  Due to concerns regarding atenolol interacting with allergy shots, atenolol was discontinued.  She was started on gabapentin 300mg  at bedtime.  She also stopped Flonase.  Headaches have improved.  She has the migraine with brainstem aura about once a month.  She has not had any stabbing headache.  She developed a burning pain on the skin of her left anterior thigh.  There is no rash, shooting pain or numbness.  She has history of "back problems" but no current back pain.  Gabapentin has helped with that, as well.  HISTORY: In November 2014, she started experiencing episodes of headache associated with slowed processing, clumsiness of her right hand, difficulty getting words out and unsteady gait, possibly veering towards the right.  The headaches were described as bi-temporal aching or stabbing pain, anywhere from 3 to 7/10.  For a period of 6 weeks, she was getting them almost daily.  She reportedly had an MRI of the brain performed 02/13/13, which was unremarkable.  She was evaluated by Dr. Jannifer Franklin at [facility] in January.  She had an MRI of the brain without contrast performed on 02/28/13, which was reviewed and was normal.  Carotid duplex performed 03/28/13 revealed no hemodynamically significant stenosis.  MRA of the head was performed on 03/29/13, which was personally reviewed and revealed focal area of decreased signal in the left ICA, thought to be artifact.  Blood work was performed on 03/16/13, which showed ANA negative, Sed Rate 3, and ACE 47.  Symptoms had resolved and migraine was suspected. She was advised to start ASA  81mg  daily.  Starting in September 2015, she began experiencing similar episodes.  At that time, she woke up one morning with headache, spinning sensation and nausea.  She fell back to sleep and when she woke up 2 hours later, it had resolved.  About 4 weeks ago, she developed another episode of dizziness, associated with headache. She also reported slight clumsiness of the right hand.  During an episode, it is more difficult to write.  It lasted 5 days.    She was evaluated for dizziness by Dr. Constance Holster, ENT.  She was experiencing vertigo and imbalance with aural fullness and possible hearing loss.  Exam was normal.   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    Personal history of headache:  No but when she was 14 or 15, she was experiencing similar episodes of dizziness. Family history of headache:  no  PAST MEDICAL HISTORY: Past Medical History  Diagnosis Date  . Hypothyroid   . Asthma   . Headache(784.0) 03/15/2013    MEDICATIONS: Current Outpatient Prescriptions on File Prior to Visit  Medication Sig Dispense Refill  . acetaminophen (TYLENOL) 325 MG tablet Take 2 tablets (650 mg total) by mouth every 6 (six) hours as needed for mild pain or moderate pain. 30 tablet 1  . BREO ELLIPTA 200-25 MCG/INH AEPB Take 1 puff by mouth daily.    Marland Kitchen gabapentin (NEURONTIN) 300 MG capsule TAKE 1 CAPSULE (300 MG TOTAL) BY MOUTH AT BEDTIME. 30 capsule 2  . levothyroxine (SYNTHROID, LEVOTHROID) 75 MCG tablet Take 75  mcg by mouth every other day.    . levothyroxine (SYNTHROID, LEVOTHROID) 88 MCG tablet Take 88 mcg by mouth daily before breakfast.    . LORazepam (ATIVAN) 0.5 MG tablet Take 1 tablet (0.5 mg total) by mouth 2 (two) times daily as needed for anxiety (usually only uses for flight). 15 tablet 1  . meclizine (ANTIVERT) 25 MG tablet Take 1 tablet (25 mg total) by mouth 3 (three) times daily as needed for dizziness. 30 tablet 1  . XOPENEX HFA 45 MCG/ACT  inhaler Inhale 2 puffs into the lungs as needed (SOB).     Marland Kitchen EPIPEN 2-PAK 0.3 MG/0.3ML SOAJ injection See admin instructions.  1   No current facility-administered medications on file prior to visit.    ALLERGIES: Allergies  Allergen Reactions  . Penicillins Hives  . Sulfa Antibiotics Hives    FAMILY HISTORY: Family History  Problem Relation Age of Onset  . Heart disease Mother   . Heart disease Father   . Melanoma Father   . Heart disease Maternal Grandmother   . Cancer Maternal Grandfather     brain cancer  . Stroke Paternal Grandfather   . Cancer Paternal Grandmother     Breast cancer     SOCIAL HISTORY: Social History   Social History  . Marital Status: Married    Spouse Name: N/A  . Number of Children: 3  . Years of Education: post grad   Occupational History  . [employer]   Social History Main Topics  . Smoking status: Never Smoker   . Smokeless tobacco: Never Used  . Alcohol Use: 0.6 oz/week    1 Standard drinks or equivalent per week     Comment: rarely  . Drug Use: No  . Sexual Activity: No   Other Topics Concern  . Not on file   Social History Narrative    REVIEW OF SYSTEMS: Constitutional: No fevers, chills, or sweats, no generalized fatigue, change in appetite Eyes: No visual changes, double vision, eye pain Ear, nose and throat: No hearing loss, ear pain, nasal congestion, sore throat Cardiovascular: No chest pain, palpitations Respiratory:  No shortness of breath at rest or with exertion, wheezes GastrointestinaI: No nausea, vomiting, diarrhea, abdominal pain, fecal incontinence Genitourinary:  No dysuria, urinary retention or frequency Musculoskeletal:  No neck pain, back pain Integumentary: No rash, pruritus, skin lesions Neurological: as above Psychiatric: No depression, insomnia, anxiety Endocrine: No palpitations, fatigue, diaphoresis, mood swings, change in appetite, change in weight, increased  thirst Hematologic/Lymphatic:  No anemia, purpura, petechiae. Allergic/Immunologic: no itchy/runny eyes, nasal congestion, recent allergic reactions, rashes  PHYSICAL EXAM: Filed Vitals:   01/31/15 0746  BP: 128/68  Pulse: 76   General: No acute distress.  Patient appears well-groomed.  normal body habitus. Head:  Normocephalic/atraumatic Eyes:  Fundoscopic exam unremarkable without vessel changes, exudates, hemorrhages or papilledema. Neck: supple, no paraspinal tenderness, full range of motion Heart:  Regular rate and rhythm Lungs:  Clear to auscultation bilaterally Back: No paraspinal tenderness Neurological Exam: alert and oriented to person, place, and time. Attention span and concentration intact, recent and remote memory intact, fund of knowledge intact.  Speech fluent and not dysarthric, language intact.  CN II-XII intact. Fundoscopic exam unremarkable without vessel changes, exudates, hemorrhages or papilledema.  Bulk and tone normal, muscle strength 5/5 throughout.  Sensation to light touch intact.  Deep tendon reflexes 2+ throughout, toes downgoing.  Finger to nose and heel to shin testing intact.  Gait normal.  IMPRESSION: Migraine with brainstem aura stable Stabbing headache stable Neuralgia, stable  PLAN: Continue gabapentin 300mg  at bedtime Follow up in 6 months.  15 minutes spent face to face with patient, over 50% spent discussing diagnoses and management.  Metta Clines, DO  CC:  Kathyrn Lass

## 2015-02-11 ENCOUNTER — Ambulatory Visit: Payer: 59 | Admitting: Neurology

## 2015-04-16 ENCOUNTER — Telehealth: Payer: Self-pay

## 2015-04-16 NOTE — Telephone Encounter (Signed)
Pt aware.

## 2015-04-16 NOTE — Telephone Encounter (Signed)
-----   Message from Pieter Partridge, DO sent at 04/16/2015 12:33 PM EST ----- I received a note from patient's PCP that her thigh pain is worse.  I believe she is taking gabapentin 300mg  at bedtime.  We can increase dose to 600mg  at bedtime.  She should contact us in a week with update.

## 2015-04-19 ENCOUNTER — Other Ambulatory Visit: Payer: Self-pay | Admitting: Neurology

## 2015-04-21 NOTE — Telephone Encounter (Signed)
Last OV: 01/31/15 Next OV: 08/08/15   Continue gabapentin 300mg  at bedtime

## 2015-05-12 ENCOUNTER — Telehealth: Payer: Self-pay | Admitting: Neurology

## 2015-05-12 MED ORDER — GABAPENTIN 300 MG PO CAPS: 600.0000 mg | ORAL_CAPSULE | Freq: Every day | ORAL | Status: DC

## 2015-05-12 NOTE — Telephone Encounter (Signed)
VM-PT left a message stating she needed a refill if gabapentin 600mg  called into the [facility] on [address] [phone]

## 2015-05-12 NOTE — Telephone Encounter (Signed)
Last OV: 01/31/15 Next OV: 08/08/15  received a note from patient's PCP that her thigh pain is worse. I believe she is taking gabapentin 300mg  at bedtime. We can increase dose to 600mg  at bedtime. She should contact us in a week with update.  RX sent in

## 2015-08-08 ENCOUNTER — Ambulatory Visit: Payer: 59 | Admitting: Neurology

## 2015-08-30 ENCOUNTER — Other Ambulatory Visit: Payer: Self-pay | Admitting: Neurology

## 2015-09-02 ENCOUNTER — Ambulatory Visit: Payer: 59 | Admitting: Neurology

## 2015-09-08 ENCOUNTER — Encounter: Payer: Self-pay | Admitting: Podiatry

## 2015-09-08 ENCOUNTER — Ambulatory Visit: Payer: Self-pay

## 2015-09-08 ENCOUNTER — Ambulatory Visit (INDEPENDENT_AMBULATORY_CARE_PROVIDER_SITE_OTHER): Payer: 59 | Admitting: Podiatry

## 2015-09-08 ENCOUNTER — Ambulatory Visit (INDEPENDENT_AMBULATORY_CARE_PROVIDER_SITE_OTHER): Payer: 59

## 2015-09-08 VITALS — BP 126/74 | HR 69 | Resp 16 | Ht 62.0 in | Wt 130.0 lb

## 2015-09-08 DIAGNOSIS — M79672 Pain in left foot: Secondary | ICD-10-CM

## 2015-09-08 DIAGNOSIS — M79671 Pain in right foot: Secondary | ICD-10-CM

## 2015-09-08 DIAGNOSIS — M205X1 Other deformities of toe(s) (acquired), right foot: Secondary | ICD-10-CM | POA: Diagnosis not present

## 2015-09-08 DIAGNOSIS — M779 Enthesopathy, unspecified: Secondary | ICD-10-CM

## 2015-09-08 MED ORDER — TRIAMCINOLONE ACETONIDE 10 MG/ML IJ SUSP
10.0000 mg | Freq: Once | INTRAMUSCULAR | Status: AC
  Administered 2015-09-08: 10 mg

## 2015-09-08 NOTE — Patient Instructions (Addendum)
Hallux Rigidus Hallux rigidus is a condition involving pain and a loss of motion of the first (big) toe. The pain gets worse with lifting up (extension) of the toe. This is usually due to arthritic bony bumps (spurring) of the joint at the base of the big toe.  SYMPTOMS   Pain, with lifting up of the toe.  Tenderness over the joint where the big toe meets the foot.  Redness, swelling, and warmth over the top of the base of the big toe (sometimes).  Foot pain, stiffness, and limping. CAUSES  Hallux rigidus is caused by arthritis of the joint where the big toe meets the foot. The arthritis creates a bone spur that pinches the soft tissues when the toe is extended. RISK INCREASES WITH:  Tight shoes with a narrow toe box.  Family history of foot problems.  Gout and rheumatoid and psoriatic arthritis.  History of previous toe injury, including "turf toe."  Long first toe, flat feet, and other big toe bony bumps.  Arthritis of the big toe. PREVENTION   Wear wide-toed shoes that fit well.  Tape the big toe to reduce motion and to prevent pinching of the tissues between the bone.  Maintain physical fitness:  Foot and ankle flexibility.  Muscle strength and endurance. PROGNOSIS  This condition can usually be managed with proper treatment. However, surgery is typically required to prevent the problem from recurring.  RELATED COMPLICATIONS  Injury to other areas of the foot or ankle, caused by abnormal walking in an attempt to avoid the pain felt when walking normally. TREATMENT Treatment first involves stopping the activities that aggravate your symptoms. Ice and medicine can be used to reduce the pain and inflammation. Modifications to shoes may help reduce pain, including wearing stiff-soled shoes, shoes with a wide toe box, inserting a padded donut to relieve pressure on top of the joint, or wearing an arch support. Corticosteroid injections may be given to reduce inflammation. If  nonsurgical treatment is unsuccessful, surgery may be needed. Surgical options include removing the arthritic bony spur, cutting a bone in the foot to change the arc of motion (allowing the toe to extend more), or fusion of the joint (eliminating all motion in the joint at the base of the big toe).  MEDICATION   If pain medicine is needed, nonsteroidal anti-inflammatory medicines (aspirin and ibuprofen), or other minor pain relievers (acetaminophen), are often advised.  Do not take pain medicine for 7 days before surgery.  Prescription pain relievers are usually prescribed only after surgery. Use only as directed and only as much as you need.  Ointments for arthritis, applied to the skin, may give some relief.  Injections of corticosteroids may be given to reduce inflammation. HEAT AND COLD  Cold treatment (icing) relieves pain and reduces inflammation. Cold treatment should be applied for 10 to 15 minutes every 2 to 3 hours, and immediately after activity that aggravates your symptoms. Use ice packs or an ice massage.  Heat treatment may be used before performing the stretching and strengthening activities prescribed by your caregiver, physical therapist, or athletic trainer. Use a heat pack or a warm water soak. SEEK MEDICAL CARE IF:   Symptoms get worse or do not improve in 2 weeks, despite treatment.  After surgery you develop fever, increasing pain, redness, swelling, drainage of fluids, bleeding, or increasing warmth.  New, unexplained symptoms develop. (Drugs used in treatment may produce side effects.)   This information is not intended to replace advice given to   you by your health care provider. Make sure you discuss any questions you have with your health care provider.   Document Released: 02/08/2005 Document Revised: 03/01/2014 Document Reviewed: 05/23/2008 Elsevier Interactive Patient Education 2016 [employer]vier Inc.  

## 2015-09-08 NOTE — Progress Notes (Signed)
   Subjective:    Patient ID: Katie Oneill, female    DOB: 09/23/1960, 55 y.o.   MRN: [id number]  HPI Chief Complaint  Patient presents with  . Foot Pain    Right foot; great toe-joint; pt stated, "Pain radiates from big to up to the top of foot"      Review of Systems  Neurological: Positive for headaches.  All other systems reviewed and are negative.      Objective:   Physical Exam        Assessment & Plan:

## 2015-09-10 NOTE — Progress Notes (Signed)
Subjective:     Patient ID: Katie Oneill, female   DOB: 08-Mar-1960, 55 y.o.   MRN: [id number]  HPI patient presents stating she has started to develop discomfort in the big toe joint right and it's been going on for at least 6 months and is given her problems prior to that but not to the same degree. Patient states that she has tried reduced activity she's tried different modalities and she was diagnosed with gout and number of years ago but does not feel that is the problem   Review of Systems  All other systems reviewed and are negative.      Objective:   Physical Exam  Constitutional: She is oriented to person, place, and time.  Cardiovascular: Intact distal pulses.   Musculoskeletal: Normal range of motion.  Neurological: She is oriented to person, place, and time.  Skin: Skin is warm.  Nursing note and vitals reviewed.  neurovascular status intact muscle strength was adequate range of motion was found to be within normal limits subtalar midtarsal joint with diminished range of motion noted of the first MPJ right foot with quite a bit of discomfort on the lateral side of the joint and across the dorsal surface. I did not note crepitus upon motion but it is blocked area patient's noted to have good digital perfusion and is well oriented 3     Assessment:     Inflammatory hallux limitus deformity right with fluid buildup around the lateral and dorsal joint surface    Plan:     H&P and x-rays reviewed with patient. At this point I did a careful injection of the lateral dorsal surface with 3 mg Kenalog 5 mg Xylocaine advised on reduced activity and scanned for orthotic with a Morton's extension. Patient will be seen back when ready or earlier if needed and understands she may ultimately require surgery on this condition  X-ray report was negative for signs of fracture or indications of stress fracture but did indicate the beginnings of structural changes the big toe joint right with  spurring dorsally and reduced motion and reduced joint space

## 2015-09-18 ENCOUNTER — Telehealth: Payer: Self-pay | Admitting: *Deleted

## 2015-09-18 NOTE — Telephone Encounter (Signed)
Pt states she got an injection at last appt, and the night of the foot was extremely painful, now has continued pain in the top of the foot and bruising, what to do.  I reviewed pt's chart and Dr. Paulla Dolly gave orders to rest. I told pt, it sounded like the evening after the injection she may have had a cortisone flare, the continued pain may be an indicator that the problem was as good as it was going to get on that one injection and offered pt an appt.  I told pt to rest, ice and elevate as much as possible until seen again and transferred to schedulers.

## 2015-09-22 ENCOUNTER — Ambulatory Visit: Payer: 59 | Admitting: Podiatry

## 2015-10-03 ENCOUNTER — Encounter: Payer: Self-pay | Admitting: Podiatry

## 2015-10-03 ENCOUNTER — Ambulatory Visit (INDEPENDENT_AMBULATORY_CARE_PROVIDER_SITE_OTHER): Payer: 59 | Admitting: Podiatry

## 2015-10-03 ENCOUNTER — Encounter: Payer: 59 | Admitting: Podiatry

## 2015-10-03 DIAGNOSIS — M205X1 Other deformities of toe(s) (acquired), right foot: Secondary | ICD-10-CM

## 2015-10-03 NOTE — Patient Instructions (Signed)

## 2015-10-05 NOTE — Progress Notes (Signed)
Subjective:     Patient ID: Katie Oneill, female   DOB: [birth year], 55 y.o.   MRN: [id number]  HPI patient presents stating I have been doing pretty well with my right foot   Review of Systems     Objective:   Physical Exam Neurovascular status intact muscle strength adequate with discomfort in the heel which is improved by about 70% but is still present upon deep palpation    Assessment:     Plantar fasciitis present but improved    Plan:     Reviewed and recommended physical therapy anti-inflammatories and supportive shoe gear usage. Patient is discharged and less any issues should occur

## 2015-10-06 ENCOUNTER — Other Ambulatory Visit: Payer: Self-pay

## 2015-10-06 MED ORDER — GABAPENTIN 300 MG PO CAPS: 600.0000 mg | ORAL_CAPSULE | Freq: Every day | ORAL | 3 refills | Status: DC

## 2015-10-06 NOTE — Telephone Encounter (Signed)
01/31/15 11/14/15   

## 2015-11-14 ENCOUNTER — Ambulatory Visit: Payer: 59 | Admitting: Neurology

## 2015-12-11 ENCOUNTER — Ambulatory Visit (INDEPENDENT_AMBULATORY_CARE_PROVIDER_SITE_OTHER): Payer: 59 | Admitting: Podiatry

## 2015-12-11 ENCOUNTER — Ambulatory Visit (INDEPENDENT_AMBULATORY_CARE_PROVIDER_SITE_OTHER): Payer: 59

## 2015-12-11 DIAGNOSIS — M79672 Pain in left foot: Secondary | ICD-10-CM | POA: Diagnosis not present

## 2015-12-11 DIAGNOSIS — M76822 Posterior tibial tendinitis, left leg: Secondary | ICD-10-CM | POA: Diagnosis not present

## 2015-12-11 MED ORDER — MELOXICAM 15 MG PO TABS: 15.0000 mg | ORAL_TABLET | Freq: Every day | ORAL | 1 refills | Status: DC

## 2015-12-13 NOTE — Progress Notes (Signed)
Subjective:  Patient presents with a new complaint today for pain and tenderness to the left foot and ankle. Patient denies trauma. Patient states that last night she felt a sharp pain on the inside of her left ankle she was not able to walk.    Objective/Physical Exam General: The patient is alert and oriented x3 in no acute distress.  Dermatology: Skin is warm, dry and supple bilateral lower extremities. Negative for open lesions or macerations.  Vascular: Palpable pedal pulses bilaterally. No edema or erythema noted. Capillary refill within normal limits.  Neurological: Epicritic and protective threshold grossly intact bilaterally.   Musculoskeletal Exam: Pain on palpation noted to the posterior tibial tendon left Range of motion within normal limits to all pedal and ankle joints bilateral. Muscle strength 5/5 in all groups bilateral.   Assessment: #1 posterior tibial tendinitis left #2 pain in left ankle and foot #3 edema left ankle and foot   Plan of Care:  #1 Patient was evaluated. #2 compression ankle sleeve dispensed #3 prescription for meloxicam 15 mg dispensed #4 patient is to return to clinic in 1 week #5 patient is going to a wedding this weekend in [place]   Dr. Edrick Kins, [place]

## 2015-12-13 NOTE — Patient Instructions (Signed)
Posterior Tibial Tendon Tendinitis With Rehab Tendonitis is a condition that is characterized by inflammation of a tendon or the lining (sheath) that surrounds it. The inflammation is usually caused by damage to the tendon, such as a tendon tear (strain). Sprains are classified into three categories. Grade 1 sprains cause pain, but the tendon is not lengthened. Grade 2 sprains include a lengthened ligament due to the ligament being stretched or partially ruptured. With grade 2 sprains there is still function, although the function may be diminished. Grade 3 sprains are characterized by a complete tear of the tendon or muscle, and function is usually impaired. Posterior tibialis tendonitis is tendonitis of the posterior tibial tendon, which attaches muscles of the lower leg to the foot. The posterior tibial tendon is located in the back of the ankle and helps the body straighten (plantar flex) and rotate inward (medially rotate) the ankle. SYMPTOMS   Pain, tenderness, swelling, warmth, and/or redness over the back of the inner ankle at the posterior tibial tendon or the inner part of the mid-foot.  Pain that worsens with plantar flexion or medial rotation of the ankle.  A crackling sound (crepitation) when the tendon is moved or touched. CAUSES  Posterior tibial tendonitis occurs when damage to the posterior tibial tendon starts an inflammatory response. Common mechanisms of injury include:  Degenerative (occurs with aging) processes that weaken the tendon and make it more susceptible to injury.  Stress placed on the tendon from an increase in the intensity, frequency, or duration of training.  Direct trauma to the ankle.  Returning to activity before a previous ankle injury is allowed to heal. RISK INCREASES WITH:  Activities that involve repetitive and/or stressful plantar flexion (jumping, kicking, or running up/down hills).  Poor strength and flexibility.  Flat feet.  Previous injury to  the foot, ankle, or leg. PREVENTION   Warm up and stretch properly before activity.  Allow for adequate recovery between workouts.  Maintain physical fitness:  Strength, flexibility, and endurance.  Cardiovascular fitness.  Learn and use proper technique. When possible, have a coach correct improper technique.  Complete rehabilitation from a previous foot, ankle, or leg injury.  If you have flat feet, wear arch supports (orthotics). PROGNOSIS  If treated properly, the symptoms of tendonitis usually resolve within 6 weeks. This period may be shorter for injuries caused by direct trauma. RELATED COMPLICATIONS   Prolonged healing time, if improperly treated or reinjured.  Recurrent symptoms that result in a chronic problem.  Partial or complete tendon tear (rupture) requiring surgery. TREATMENT  Treatment initially involves the use of ice and medication to help reduce pain and inflammation. The use of strengthening and stretching exercises may help reduce pain with activity. These exercises may be performed at home or with referral to a therapist. Often times, your caregiver will recommend immobilizing the ankle to allow the tendon to heal. If you have flat feet, you may be advised to wear orthotic arch supports. If symptoms persist for greater than 6 months despite nonsurgical (conservative) treatment, then surgery may be recommended. MEDICATION   If pain medication is necessary, then nonsteroidal anti-inflammatory medications, such as aspirin and ibuprofen, or other minor pain relievers, such as acetaminophen, are often recommended.  Do not take pain medication for 7 days before surgery.  Prescription pain relievers may be given if deemed necessary by your caregiver. Use only as directed and only as much as you need.  Corticosteroid injections may be given by your caregiver. These injections should   be reserved for the most serious cases because they may only be given a certain  number of times. HEAT AND COLD  Cold treatment (icing) relieves pain and reduces inflammation. Cold treatment should be applied for 10 to 15 minutes every 2 to 3 hours for inflammation and pain and immediately after any activity that aggravates your symptoms. Use ice packs or massage the area with a piece of ice (ice massage).  Heat treatment may be used prior to performing the stretching and strengthening activities prescribed by your caregiver, physical therapist, or athletic trainer. Use a heat pack or soak the injury in warm water. SEEK MEDICAL CARE IF:  Treatment seems to offer no benefit, or the condition worsens.  Any medications produce adverse side effects. EXERCISES RANGE OF MOTION (ROM) AND STRETCHING EXERCISES - Posterior Tibial Tendon Tendinitis These exercises may help you when beginning to rehabilitate your injury. Your symptoms may resolve with or without further involvement from your physician, physical therapist or athletic trainer. While completing these exercises, remember:   Restoring tissue flexibility helps normal motion to return to the joints. This allows healthier, less painful movement and activity.  An effective stretch should be held for at least 30 seconds.  A stretch should never be painful. You should only feel a gentle lengthening or release in the stretched tissue. RANGE OF MOTION - Ankle Plantar Flexion   Sit with your right / left leg crossed over your opposite knee.  Use your opposite hand to pull the top of your foot and toes toward you.  You should feel a gentle stretch on the top of your foot/ankle. Hold this position for __________ seconds. Repeat __________ times. Complete this exercise __________ times per day.  RANGE OF MOTION - Ankle Eversion   Sit with your right / left ankle crossed over your opposite knee.  Grip your foot with your opposite hand, placing your thumb on the top of your foot and your fingers across the bottom of your  foot.  Gently push your foot downward with a slight rotation so your littlest toes rise slightly.  You should feel a gentle stretch on the inside of your ankle. Hold the stretch for __________ seconds. Repeat __________ times. Complete this exercise __________ times per day.  RANGE OF MOTION - Ankle Inversion   Sit with your right / left ankle crossed over your opposite knee.  Grip your foot with your opposite hand, placing your thumb on the bottom of your foot and your fingers across the top of your foot.  Gently pull your foot so the smallest toe comes toward you and your thumb pushes the inside of the ball of your foot away from you.  You should feel a gentle stretch on the outside of your ankle. Hold the stretch for __________ seconds. Repeat __________ times. Complete this exercise __________ times per day.  RANGE OF MOTION - Dorsi/Plantar Flexion  While sitting with your right / left knee straight, draw the top of your foot upward by flexing your ankle. Then reverse the motion, pointing your toes downward.  Hold each position for __________ seconds.  After completing your first set of exercises, repeat this exercise with your knee bent. Repeat __________ times. Complete this exercise __________ times per day.  RANGE OF MOTION - Ankle Alphabet  Imagine your right / left big toe is a pen.  Keeping your hip and knee still, write out the entire alphabet with your "pen." Make the letters as large as you can without   increasing any discomfort. Repeat __________ times. Complete this exercise __________ times per day.  STRETCH - Gastrocsoleus   Sit with your right / left leg extended. Holding onto both ends of a belt or towel, loop it around the ball of your foot.  Keeping your right / left ankle and foot relaxed and your knee straight, pull your foot and ankle toward you using the belt/towel.  You should feel a gentle stretch behind your calf or knee. Hold this position for  __________ seconds. Repeat __________ times. Complete this exercise __________ times per day.  STRETCH - Gastroc, Standing   Place hands on wall.  Extend right / left leg, keeping the front knee somewhat bent.  Slightly point your toes inward on your back foot.  Keeping your right / left heel on the floor and your knee straight, shift your weight toward the wall, not allowing your back to arch.  You should feel a gentle stretch in the right / left calf. Hold this position for __________ seconds. Repeat __________ times. Complete this stretch __________ times per day. STRETCH - Soleus, Standing   Place hands on wall.  Extend right / left leg, keeping the other knee somewhat bent.  Slightly point your toes inward on your back foot.  Keep your right / left heel on the floor, bend your back knee, and slightly shift your weight over the back leg so that you feel a gentle stretch deep in your back calf.  Hold this position for __________ seconds. Repeat __________ times. Complete this stretch __________ times per day. STRENGTHENING EXERCISES - Posterior Tibial Tendon Tendinitis These exercises may help you when beginning to rehabilitate your injury. They may resolve your symptoms with or without further involvement from your physician, physical therapist, or athletic trainer. While completing these exercises, remember:   Muscles can gain both the endurance and the strength needed for everyday activities through controlled exercises.  Complete these exercises as instructed by your physician, physical therapist, or athletic trainer. Progress the resistance and repetitions only as guided. STRENGTH - Dorsiflexors  Secure a rubber exercise band/tubing to a fixed object (i.e., table, pole) and loop the other end around your right / left foot.  Sit on the floor facing the fixed object. The band/tubing should be slightly tense when your foot is relaxed.  Slowly draw your foot back toward you  using your ankle and toes.  Hold this position for __________ seconds. Slowly release the tension in the band and return your foot to the starting position. Repeat __________ times. Complete this exercise __________ times per day.  STRENGTH - Towel Curls  Sit in a chair positioned on a non-carpeted surface.  Place your foot on a towel, keeping your heel on the floor.  Pull the towel toward your heel by only curling your toes. Keep your heel on the floor.  If instructed by your physician, physical therapist, or athletic trainer, add ____________________ at the end of the towel. Repeat __________ times. Complete this exercise __________ times per day. STRENGTH - Ankle Eversion   Secure one end of a rubber exercise band/tubing to a fixed object (table, pole). Loop the other end around your foot just before your toes.  Place your fists between your knees. This will focus your strengthening at your ankle.  Drawing the band/tubing across your opposite foot, slowly pull your little toe out and up. Make sure the band/tubing is positioned to resist the entire motion.  Hold this position for __________ seconds.  Have   your muscles resist the band/tubing as it slowly pulls your foot back to the starting position. Repeat __________ times. Complete this exercise __________ times per day.  STRENGTH - Ankle Inversion   Secure one end of a rubber exercise band/tubing to a fixed object (table, pole). Loop the other end around your foot just before your toes.  Place your fists between your knees. This will focus your strengthening at your ankle.  Slowly, pull your big toe up and in, making sure the band/tubing is positioned to resist the entire motion.  Hold this position for __________ seconds.  Have your muscles resist the band/tubing as it slowly pulls your foot back to the starting position. Repeat __________ times. Complete this exercises __________ times per day.    This information is not  intended to replace advice given to you by your health care provider. Make sure you discuss any questions you have with your health care provider.   Document Released: 02/08/2005 Document Revised: 06/25/2014 Document Reviewed: 05/23/2008 Elsevier Interactive Patient Education 2016 [employer]  

## 2015-12-18 ENCOUNTER — Ambulatory Visit: Payer: 59 | Admitting: Podiatry

## 2015-12-19 ENCOUNTER — Ambulatory Visit (INDEPENDENT_AMBULATORY_CARE_PROVIDER_SITE_OTHER): Payer: 59 | Admitting: Neurology

## 2015-12-19 ENCOUNTER — Encounter: Payer: Self-pay | Admitting: Neurology

## 2015-12-19 VITALS — BP 116/68 | HR 63 | Ht 62.0 in | Wt 137.0 lb

## 2015-12-19 DIAGNOSIS — G5792 Unspecified mononeuropathy of left lower limb: Secondary | ICD-10-CM | POA: Diagnosis not present

## 2015-12-19 DIAGNOSIS — G43809 Other migraine, not intractable, without status migrainosus: Secondary | ICD-10-CM

## 2015-12-19 DIAGNOSIS — G43109 Migraine with aura, not intractable, without status migrainosus: Secondary | ICD-10-CM

## 2015-12-19 DIAGNOSIS — M792 Neuralgia and neuritis, unspecified: Secondary | ICD-10-CM

## 2015-12-19 DIAGNOSIS — G4485 Primary stabbing headache: Secondary | ICD-10-CM

## 2015-12-19 MED ORDER — GABAPENTIN 300 MG PO CAPS: 600.0000 mg | ORAL_CAPSULE | Freq: Every day | ORAL | 11 refills | Status: DC

## 2015-12-19 NOTE — Progress Notes (Signed)
NEUROLOGY FOLLOW UP OFFICE NOTE  Monai Sugawara HD:[id number]  HISTORY OF PRESENT ILLNESS: Katie Oneill is a 55 year old right-handed woman with history of hypothyroidism and asthma who follows up for vestibular migraine, stabbing headache and thigh pain.   UPDATE: She is taking gabapentin 600mg  at bedtime.  Headaches are much improved.  She has one once in a while.  The neuralgia of the left thigh has resolved.   HISTORY: In November 2014, she started experiencing episodes of headache associated with slowed processing, clumsiness of her right hand, difficulty getting words out and unsteady gait, possibly veering towards the right.  The headaches were described as bi-temporal aching or stabbing pain, anywhere from 3 to 7/10.  For a period of 6 weeks, she was getting them almost daily.  She reportedly had an MRI of the brain performed 02/13/13, which was unremarkable.  She had an MRI of the brain without contrast performed on 02/28/13, which was reviewed and was normal.  Carotid duplex performed 03/28/13 revealed no hemodynamically significant stenosis.  MRA of the head was performed on 03/29/13, which was personally reviewed and revealed focal area of decreased signal in the left ICA, thought to be artifact.  Blood work was performed on 03/16/13, which showed ANA negative, Sed Rate 3, and ACE 47.  Symptoms had resolved and migraine was suspected. She was advised to start ASA 81mg  daily.   Starting in September 2015, she began experiencing similar episodes.  At that time, she woke up one morning with headache, spinning sensation and nausea.  She fell back to sleep and when she woke up 2 hours later, it had resolved.  About 4 weeks ago, she developed another episode of dizziness, associated with headache. She also reported slight clumsiness of the right hand.  During an episode, it is more difficult to write.  It lasted 5 days.     She was evaluated for dizziness by Dr. Constance Holster, ENT.  She was experiencing  vertigo and imbalance with aural fullness and possible hearing loss.  Exam was normal.    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    Personal history of headache:  No but when she was 14 or 15, she was experiencing similar episodes of dizziness. Family history of headache:  no  PAST MEDICAL HISTORY: Past Medical History:  Diagnosis Date  . Asthma   . GERD (gastroesophageal reflux disease)   . Gout   . Headache(784.0) 03/15/2013  . Hypothyroid   . IBS (irritable bowel syndrome)     MEDICATIONS: Current Outpatient Prescriptions on File Prior to Visit  Medication Sig Dispense Refill  . acetaminophen (TYLENOL) 325 MG tablet Take 2 tablets (650 mg total) by mouth every 6 (six) hours as needed for mild pain or moderate pain. 30 tablet 1  . BREO ELLIPTA 200-25 MCG/INH AEPB Take 1 puff by mouth daily.    Marland Kitchen EPIPEN 2-PAK 0.3 MG/0.3ML SOAJ injection See admin instructions.  1  . levothyroxine (SYNTHROID, LEVOTHROID) 75 MCG tablet Take 75 mcg by mouth every other day.    . levothyroxine (SYNTHROID, LEVOTHROID) 88 MCG tablet Take 88 mcg by mouth daily before breakfast.    . LORazepam (ATIVAN) 0.5 MG tablet Take 1 tablet (0.5 mg total) by mouth 2 (two) times daily as needed for anxiety (usually only uses for flight). 15 tablet 1  . meclizine (ANTIVERT) 25 MG tablet Take 1 tablet (25 mg total) by mouth 3 (three) times daily as needed  for dizziness. 30 tablet 1  . pantoprazole (PROTONIX) 40 MG tablet Take 40 mg by mouth daily.    Marland Kitchen PREMARIN vaginal cream     . Probiotic Product (PROBIOTIC ADVANCED PO) Take by mouth daily.    Penne Lash HFA 45 MCG/ACT inhaler Inhale 2 puffs into the lungs as needed (SOB).      No current facility-administered medications on file prior to visit.     ALLERGIES: Allergies  Allergen Reactions  . Shellfish Allergy Nausea And Vomiting  . Penicillins Hives  . Sulfa Antibiotics Hives    FAMILY HISTORY: Family History    Problem Relation Age of Onset  . Heart disease Mother   . Heart disease Father   . Melanoma Father   . Heart disease Maternal Grandmother   . Cancer Maternal Grandfather     brain cancer  . Stroke Paternal Grandfather   . Cancer Paternal Grandmother     Breast cancer     SOCIAL HISTORY: Social History   Social History  . Marital status: Married    Spouse name: N/A  . Number of children: 3  . Years of education: post grad   Occupational History  . [employer]   Social History Main Topics  . Smoking status: Never Smoker  . Smokeless tobacco: Never Used  . Alcohol use 0.6 oz/week    1 Standard drinks or equivalent per week     Comment: rarely  . Drug use: No  . Sexual activity: No   Other Topics Concern  . Not on file   Social History Narrative  . No narrative on file    REVIEW OF SYSTEMS: Constitutional: No fevers, chills, or sweats, no generalized fatigue, change in appetite Eyes: No visual changes, double vision, eye pain Ear, nose and throat: No hearing loss, ear pain, nasal congestion, sore throat Cardiovascular: No chest pain, palpitations Respiratory:  No shortness of breath at rest or with exertion, wheezes GastrointestinaI: No nausea, vomiting, diarrhea, abdominal pain, fecal incontinence Genitourinary:  No dysuria, urinary retention or frequency Musculoskeletal:  No neck pain, back pain Integumentary: No rash, pruritus, skin lesions Neurological: as above Psychiatric: No depression, insomnia, anxiety Endocrine: No palpitations, fatigue, diaphoresis, mood swings, change in appetite, change in weight, increased thirst Hematologic/Lymphatic:  No purpura, petechiae. Allergic/Immunologic: no itchy/runny eyes, nasal congestion, recent allergic reactions, rashes  PHYSICAL EXAM: Vitals:   12/19/15 0730  BP: 116/68  Pulse: 63   General: No acute distress.  Patient appears well-groomed.  normal body habitus. Head:   Normocephalic/atraumatic Eyes:  Fundi examined but not visualized Neck: supple, no paraspinal tenderness, full range of motion Heart:  Regular rate and rhythm Lungs:  Clear to auscultation bilaterally Back: No paraspinal tenderness Neurological Exam: alert and oriented to person, place, and time. Attention span and concentration intact, recent and remote memory intact, fund of knowledge intact.  Speech fluent and not dysarthric, language intact.  CN II-XII intact. Bulk and tone normal, muscle strength 5/5 throughout.  Sensation to light touch  intact.  Deep tendon reflexes 2+ throughout.  Finger to nose testing intact.  Gait normal  IMPRESSION: Migraine with brainstem aura stable Stabbing headache stable Neuralgia, stable  PLAN: Gabapentin 600mg  at bedtime Follow up in 9 months or as needed.  15 minutes spent face to face with patient, over 50% spent counseling.  Metta Clines, DO  CC:  Emi Belfast, MD

## 2015-12-19 NOTE — Progress Notes (Signed)
Chart forwarded.  

## 2015-12-19 NOTE — Patient Instructions (Signed)
Continue gabapentin 600mg  at bedtime Follow up in 9 months.  Contact us with any issues.

## 2016-03-09 ENCOUNTER — Other Ambulatory Visit: Payer: Self-pay | Admitting: Neurology

## 2016-06-02 ENCOUNTER — Ambulatory Visit (INDEPENDENT_AMBULATORY_CARE_PROVIDER_SITE_OTHER): Payer: 59 | Admitting: Podiatry

## 2016-06-02 ENCOUNTER — Ambulatory Visit (INDEPENDENT_AMBULATORY_CARE_PROVIDER_SITE_OTHER): Payer: 59

## 2016-06-02 ENCOUNTER — Ambulatory Visit: Payer: 59

## 2016-06-02 DIAGNOSIS — M76822 Posterior tibial tendinitis, left leg: Secondary | ICD-10-CM

## 2016-06-02 DIAGNOSIS — M205X1 Other deformities of toe(s) (acquired), right foot: Secondary | ICD-10-CM

## 2016-06-02 NOTE — Progress Notes (Signed)
Subjective:     Patient ID: Katie Oneill, female   DOB: Oct 05, 1960, 56 y.o.   MRN: [id number]  HPI patient presents stating that she's having a lot of more discomfort around the big toe joint right and she wants it fixed and also she continues to have discomfort on the inside of the left arch especially when she gets up in the morning   Review of Systems     Objective:   Physical Exam Neurovascular status intact muscle strength adequate range of motion within normal limits with patient found to have limitation of motion first MPJ right with crepitus of the joint and pain with palpation. The left posterior tib is mildly sore    Assessment:     Hallux limitus rigidus deformity right with moderate tendinitis left    Plan:     H&P and condition reviewed. Patient wants surgery due to the long-term nature of this condition and at this time I allowed her to read consent form reviewing procedure and alternative treatments. I spent a great deal time going over complications and the fact that there may be cartilage damage and that eventually she may require joint implantation or fusion procedure. Patient wants surgery and at this time signed consent form and was given all instructions and also air fracture walker for the postoperative period with instructions to get used to it prior to procedure. Patient understands recovery for full return to activity can take 6 months to one year  X-ray report indicates there is spurring of the dorsal surface first metatarsal with narrowness the joint surface and moderate elevation of the first metatarsal second

## 2016-06-02 NOTE — Patient Instructions (Signed)
Pre-Operative Instructions  Congratulations, you have decided to take an important step to improving your quality of life.  You can be assured that the doctors of [facility] will be with you every step of the way.  1. Plan to be at the surgery center/hospital at least 1 (one) hour prior to your scheduled time unless otherwise directed by the surgical center/hospital staff.  You must have a responsible adult accompany you, remain during the surgery and drive you home.  Make sure you have directions to the surgical center/hospital and know how to get there on time. 2. For hospital based surgery you will need to obtain a history and physical form from your family physician within 1 month prior to the date of surgery- we will give you a form for you primary physician.  3. We make every effort to accommodate the date you request for surgery.  There are however, times where surgery dates or times have to be moved.  We will contact you as soon as possible if a change in schedule is required.   4. No Aspirin/Ibuprofen for one week before surgery.  If you are on aspirin, any non-steroidal anti-inflammatory medications (Mobic, Aleve, Ibuprofen) you should stop taking it 7 days prior to your surgery.  You make take Tylenol  For pain prior to surgery.  5. Medications- If you are taking daily heart and blood pressure medications, seizure, reflux, allergy, asthma, anxiety, pain or diabetes medications, make sure the surgery center/hospital is aware before the day of surgery so they may notify you which medications to take or avoid the day of surgery. 6. No food or drink after midnight the night before surgery unless directed otherwise by surgical center/hospital staff. 7. No alcoholic beverages 24 hours prior to surgery.  No smoking 24 hours prior to or 24 hours after surgery. 8. Wear loose pants or shorts- loose enough to fit over bandages, boots, and casts. 9. No slip on shoes, sneakers are best. 10. Bring  your boot with you to the surgery center/hospital.  Also bring crutches or a walker if your physician has prescribed it for you.  If you do not have this equipment, it will be provided for you after surgery. 11. If you have not been contracted by the surgery center/hospital by the day before your surgery, call to confirm the date and time of your surgery. 12. Leave-time from work may vary depending on the type of surgery you have.  Appropriate arrangements should be made prior to surgery with your employer. 13. Prescriptions will be provided immediately following surgery by your doctor.  Have these filled as soon as possible after surgery and take the medication as directed. 14. Remove nail polish on the operative foot. 15. Wash the night before surgery.  The night before surgery wash the foot and leg well with the antibacterial soap provided and water paying special attention to beneath the toenails and in between the toes.  Rinse thoroughly with water and dry well with a towel.  Perform this wash unless told not to do so by your physician.  Enclosed: 1 Ice pack (please put in freezer the night before surgery)   1 Hibiclens skin cleaner   Pre-op Instructions  If you have any questions regarding the instructions, do not hesitate to call our office.  [place]: [address] [place], [place] [address] [phone]  [place]: [address] [place], [place] [address] [phone]  [place]: [address]  [place], [place] [address] [phone]   Dr.   Carisma Troupe DPM, Dr. Matthew Wagoner DPM, Dr. M. Todd Hyatt DPM, Dr. Titorya Stover DPM 

## 2016-07-02 ENCOUNTER — Other Ambulatory Visit: Payer: Self-pay | Admitting: Neurology

## 2016-08-03 ENCOUNTER — Telehealth: Payer: Self-pay | Admitting: *Deleted

## 2016-08-03 NOTE — Telephone Encounter (Signed)
"  I'm calling regarding the pre-authorization that was submitted for Katie Oneill  I'm calling to request clinical information be faxed over to [place] at [phone].  Place attention to the service reference number [id number].

## 2016-08-04 NOTE — Telephone Encounter (Signed)
Surgery was authorized for 08/10/2016.  Retrieved approval on-line [facility]  I faxed it to Caren Griffins at [facility].

## 2016-08-10 ENCOUNTER — Encounter: Payer: Self-pay | Admitting: Podiatry

## 2016-08-10 DIAGNOSIS — M2011 Hallux valgus (acquired), right foot: Secondary | ICD-10-CM | POA: Diagnosis not present

## 2016-08-10 DIAGNOSIS — M722 Plantar fascial fibromatosis: Secondary | ICD-10-CM | POA: Diagnosis not present

## 2016-08-18 ENCOUNTER — Encounter: Payer: Self-pay | Admitting: Podiatry

## 2016-08-18 ENCOUNTER — Ambulatory Visit (INDEPENDENT_AMBULATORY_CARE_PROVIDER_SITE_OTHER): Payer: 59

## 2016-08-18 ENCOUNTER — Ambulatory Visit (INDEPENDENT_AMBULATORY_CARE_PROVIDER_SITE_OTHER): Payer: 59 | Admitting: Podiatry

## 2016-08-18 DIAGNOSIS — M2011 Hallux valgus (acquired), right foot: Secondary | ICD-10-CM

## 2016-08-18 DIAGNOSIS — M205X1 Other deformities of toe(s) (acquired), right foot: Secondary | ICD-10-CM

## 2016-08-18 NOTE — Progress Notes (Signed)
Subjective:    Patient ID: Katie Oneill, female   DOB: 56 y.o.   MRN: [id number]   HPI patient states she's doing well with mild discomfort    ROS      Objective:  Physical Exam neurovascular status intact negative Homans sign was noted with patient's first MPJ right doing well with good range of motion no crepitus of the joint surface noted and good alignment noted with wound edges well coapted     Assessment:    Doing well post surgery right foot     Plan:    H&P and x-ray reviewed and sterile dressing reapplied. Discussed continuation of elevation compression immobilization and ibuprofen usage and reappoint 2 weeks or earlier if necessary. Dispensed a above ankle ankle brace with BioSkin type device to lower the right hallux and stretch the capsule and instructed on range of motion exercises  X-rays indicate that there is good healing of the osteotomy pins in place joint open and congruence with no spurring

## 2016-08-20 ENCOUNTER — Telehealth: Payer: Self-pay | Admitting: Podiatry

## 2016-08-20 NOTE — Telephone Encounter (Signed)
Left message informing pt should could use a loose regular sock.

## 2016-08-20 NOTE — Progress Notes (Signed)
DOS 06.19.2018 Bi-Planar [place] right foot with pin fixation and removal spurs.

## 2016-08-20 NOTE — Telephone Encounter (Signed)
Patient was seen for her first post-op on Wednesday 27 June for Bi-Planar [place] right foot with pin fixation and removal spurs and when she was seen she was given a sock. Wants to know since she only got one if she needs to get another one from [place] or if she can wear a regular sock.

## 2016-08-27 ENCOUNTER — Telehealth: Payer: Self-pay

## 2016-08-27 ENCOUNTER — Telehealth: Payer: Self-pay | Admitting: Podiatry

## 2016-08-27 NOTE — Telephone Encounter (Signed)
Yes, Dr. Paulla Dolly did surgery a few weeks ago and I was given a brace for my toe. My husband and I cannot figure it out and do not know if my toe is supposed to go up or down. It is a black brace that has Velcro.

## 2016-08-27 NOTE — Telephone Encounter (Signed)
Spoke with patient instructing her on use of bioskin with use of hallux brace

## 2016-08-31 ENCOUNTER — Other Ambulatory Visit: Payer: 59

## 2016-09-01 ENCOUNTER — Ambulatory Visit (INDEPENDENT_AMBULATORY_CARE_PROVIDER_SITE_OTHER): Payer: 59 | Admitting: Podiatry

## 2016-09-01 ENCOUNTER — Ambulatory Visit (INDEPENDENT_AMBULATORY_CARE_PROVIDER_SITE_OTHER): Payer: 59

## 2016-09-01 ENCOUNTER — Encounter: Payer: Self-pay | Admitting: Podiatry

## 2016-09-01 VITALS — BP 111/79 | HR 66 | Resp 16

## 2016-09-01 DIAGNOSIS — M2011 Hallux valgus (acquired), right foot: Secondary | ICD-10-CM

## 2016-09-01 NOTE — Telephone Encounter (Signed)
Dr. Paulla Dolly states may begin exercising, but not jumping, no standing on her toes or bending in the ball of the foot for the next 2-3 weeks then begin gradually. I informed pt of Dr. Mellody Drown orders and she stated understanding.

## 2016-09-01 NOTE — Progress Notes (Signed)
Subjective:    Patient ID: Katie Oneill, female   DOB: 56 y.o.   MRN: [id number]   HPI patient states her right foot is doing well with diminished discomfort    ROS      Objective:  Physical Exam neurovascular status intact with patient's osteotomy right healing well with wound edges well coapted joint congruence and negative Homans sign noted with good range of motion     Assessment:   Doing well post osteotomy first metatarsal right      Plan:   X-ray reviewed and at this point patient can gradually increase activity levels. Patient will continue surgical shoe usage and will be seen back to recheck 4 weeks or earlier if necessary with anklet given to patient today  X-ray indicates that the osteotomy is healing well with pins in place joint congruence no indications of pathology

## 2016-09-01 NOTE — Telephone Encounter (Signed)
Pt wants to know when can she start exercising again. (i.e. Yoga, pilates, walking). Request a call back at [phone] and can leave a message if she doesn't answer.

## 2016-09-17 ENCOUNTER — Ambulatory Visit (INDEPENDENT_AMBULATORY_CARE_PROVIDER_SITE_OTHER): Payer: 59 | Admitting: Neurology

## 2016-09-17 ENCOUNTER — Encounter: Payer: Self-pay | Admitting: Neurology

## 2016-09-17 VITALS — BP 110/64 | HR 62 | Ht 62.0 in | Wt 144.4 lb

## 2016-09-17 DIAGNOSIS — G43109 Migraine with aura, not intractable, without status migrainosus: Secondary | ICD-10-CM

## 2016-09-17 DIAGNOSIS — G4485 Primary stabbing headache: Secondary | ICD-10-CM

## 2016-09-17 DIAGNOSIS — G5792 Unspecified mononeuropathy of left lower limb: Secondary | ICD-10-CM | POA: Diagnosis not present

## 2016-09-17 DIAGNOSIS — G43809 Other migraine, not intractable, without status migrainosus: Secondary | ICD-10-CM

## 2016-09-17 DIAGNOSIS — M792 Neuralgia and neuritis, unspecified: Secondary | ICD-10-CM

## 2016-09-17 MED ORDER — GABAPENTIN 300 MG PO CAPS: 600.0000 mg | ORAL_CAPSULE | Freq: Every day | ORAL | 3 refills | Status: DC

## 2016-09-17 NOTE — Patient Instructions (Signed)
Continue gabapentin 600mg at bedtime 

## 2016-09-17 NOTE — Progress Notes (Signed)
NEUROLOGY FOLLOW UP OFFICE NOTE  Katie Oneill [id number]  HISTORY OF PRESENT ILLNESS: Katie Oneill is a 56 year old right-handed woman with history of hypothyroidism and asthma who follows up for vestibular migraine, stabbing headache and thigh pain.   UPDATE: She is taking gabapentin 600mg  at bedtime.   Since Last visit, no vestibular migraine or primary stabbing headache.  Neuralgia of thigh controlled.  Depression/stress:  okay Sleep hygiene:  Variable   HISTORY: In November 2014, she started experiencing episodes of headache associated with slowed processing, clumsiness of her right hand, difficulty getting words out and unsteady gait, possibly veering towards the right.  The headaches were described as bi-temporal aching or stabbing pain, anywhere from 3 to 7/10.  For a period of 6 weeks, she was getting them almost daily.  She reportedly had an MRI of the brain performed 02/13/13, which was unremarkable.  She had an MRI of the brain without contrast performed on 02/28/13, which was reviewed and was normal.  Carotid duplex performed 03/28/13 revealed no hemodynamically significant stenosis.  MRA of the head was performed on 03/29/13, which was personally reviewed and revealed focal area of decreased signal in the left ICA, thought to be artifact.  Blood work was performed on 03/16/13, which showed ANA negative, Sed Rate 3, and ACE 47.  Symptoms had resolved and migraine was suspected. She was advised to start ASA 81mg  daily.   Starting in September 2015, she began experiencing similar episodes.  At that time, she woke up one morning with headache, spinning sensation and nausea.  She fell back to sleep and when she woke up 2 hours later, it had resolved.  About 4 weeks ago, she developed another episode of dizziness, associated with headache. She also reported slight clumsiness of the right hand.  During an episode, it is more difficult to write.  It lasted 5 days.     She was evaluated for  dizziness by Dr. Constance Holster, ENT.  She was experiencing vertigo and imbalance with aural fullness and possible hearing loss.  Exam was normal.       Personal history of headache:  No but when she was 14 or 15, she was experiencing similar episodes of dizziness. Family history of headache:  no  PAST MEDICAL HISTORY: Past Medical History:  Diagnosis Date  . Asthma   . GERD (gastroesophageal reflux disease)   . Gout   . Headache(784.0) 03/15/2013  . Hypothyroid   . IBS (irritable bowel syndrome)     MEDICATIONS: Current Outpatient Prescriptions on File Prior to Visit  Medication Sig Dispense Refill  . acetaminophen (TYLENOL) 325 MG tablet Take 2 tablets (650 mg total) by mouth every 6 (six) hours as needed for mild pain or moderate pain. 30 tablet 1  . BREO ELLIPTA 200-25 MCG/INH AEPB Take 1 puff by mouth daily.    Marland Kitchen EPIPEN 2-PAK 0.3 MG/0.3ML SOAJ injection See admin instructions.  1  . levothyroxine (SYNTHROID, LEVOTHROID) 75 MCG tablet Take 75 mcg by mouth every other day.    . levothyroxine (SYNTHROID, LEVOTHROID) 88 MCG tablet Take 88 mcg by mouth daily before breakfast.    . LORazepam (ATIVAN) 0.5 MG tablet Take 1 tablet (0.5 mg total) by mouth 2 (two) times daily as needed for anxiety (usually only uses for flight). 15 tablet 1  . meclizine (ANTIVERT) 25 MG tablet Take 1 tablet (25 mg total) by mouth 3 (three) times daily as needed for dizziness. 30 tablet 1  . ondansetron (ZOFRAN) 4  MG tablet Take 4 mg by mouth every 8 (eight) hours as needed for nausea or vomiting (Take 1 tablet by mouth every eight hours as needed for nausea).    . pantoprazole (PROTONIX) 40 MG tablet Take 40 mg by mouth daily.    Marland Kitchen PREMARIN vaginal cream     . Probiotic Product (PROBIOTIC ADVANCED PO) Take by mouth daily.    Penne Lash HFA 45 MCG/ACT inhaler Inhale 2 puffs into the lungs as needed (SOB).      No current facility-administered medications on file prior to visit.     ALLERGIES: Allergies  Allergen  Reactions  . Shellfish Allergy Nausea And Vomiting  . Penicillins Hives  . Sulfa Antibiotics Hives    FAMILY HISTORY: Family History  Problem Relation Age of Onset  . Heart disease Mother   . Heart disease Father   . Melanoma Father   . Heart disease Maternal Grandmother   . Cancer Maternal Grandfather        brain cancer  . Stroke Paternal Grandfather   . Cancer Paternal Grandmother        Breast cancer     SOCIAL HISTORY: Social History   Social History  . Marital status: Married    Spouse name: N/A  . Number of children: 3  . Years of education: post grad   Occupational History  . [employer]   Social History Main Topics  . Smoking status: Never Smoker  . Smokeless tobacco: Never Used  . Alcohol use 0.6 oz/week    1 Standard drinks or equivalent per week     Comment: rarely  . Drug use: No  . Sexual activity: No   Other Topics Concern  . Not on file   Social History Narrative  . No narrative on file    REVIEW OF SYSTEMS: Constitutional: No fevers, chills, or sweats, no generalized fatigue, change in appetite Eyes: No visual changes, double vision, eye pain Ear, nose and throat: No hearing loss, ear pain, nasal congestion, sore throat Cardiovascular: No chest pain, palpitations Respiratory:  No shortness of breath at rest or with exertion, wheezes GastrointestinaI: No nausea, vomiting, diarrhea, abdominal pain, fecal incontinence Genitourinary:  No dysuria, urinary retention or frequency Musculoskeletal:  No neck pain, back pain Integumentary: No rash, pruritus, skin lesions Neurological: as above Psychiatric: No depression, insomnia, anxiety Endocrine: No palpitations, fatigue, diaphoresis, mood swings, change in appetite, change in weight, increased thirst Hematologic/Lymphatic:  No purpura, petechiae. Allergic/Immunologic: no itchy/runny eyes, nasal congestion, recent allergic reactions, rashes  PHYSICAL  EXAM: Vitals:   09/17/16 0737  BP: 110/64  Pulse: 62   General: No acute distress.  Patient appears well-groomed.  normal body habitus. Head:  Normocephalic/atraumatic Eyes:  Fundi examined but not visualized Neck: supple, no paraspinal tenderness, full range of motion Heart:  Regular rate and rhythm Lungs:  Clear to auscultation bilaterally Back: No paraspinal tenderness Neurological Exam: alert and oriented to person, place, and time. Attention span and concentration intact, recent and remote memory intact, fund of knowledge intact.  Speech fluent and not dysarthric, language intact.  CN II-XII intact. Bulk and tone normal, muscle strength 5/5 throughout.  Sensation to light touch, temperature and vibration intact.  Deep tendon reflexes 2+ throughout, toes downgoing.  Finger to nose and heel to shin testing intact.  Gait normal, Romberg negative.  IMPRESSION: Migraine with brainstem aura stable Stabbing headache stable Neuralgia, stable  PLAN: Refilled gabapentin 600mg  at bedtime Follow  up in one year or as needed.  15 minutes spent face to face with patient, over 50% spent discussing management.  Metta Clines, DO  CC:  Emi Belfast, MD

## 2016-10-06 ENCOUNTER — Encounter: Payer: Self-pay | Admitting: Podiatry

## 2016-10-06 ENCOUNTER — Ambulatory Visit (INDEPENDENT_AMBULATORY_CARE_PROVIDER_SITE_OTHER): Payer: 59

## 2016-10-06 ENCOUNTER — Ambulatory Visit (INDEPENDENT_AMBULATORY_CARE_PROVIDER_SITE_OTHER): Payer: 59 | Admitting: Podiatry

## 2016-10-06 VITALS — BP 118/69 | HR 62 | Resp 16

## 2016-10-06 DIAGNOSIS — M2011 Hallux valgus (acquired), right foot: Secondary | ICD-10-CM

## 2016-11-22 ENCOUNTER — Other Ambulatory Visit: Payer: Self-pay | Admitting: Internal Medicine

## 2016-11-22 DIAGNOSIS — R7989 Other specified abnormal findings of blood chemistry: Secondary | ICD-10-CM

## 2016-11-23 ENCOUNTER — Ambulatory Visit
Admission: RE | Admit: 2016-11-23 | Discharge: 2016-11-23 | Disposition: A | Discharge status: Home / Self Care | Payer: 59 | Source: Ambulatory Visit | Attending: Internal Medicine | Admitting: Internal Medicine

## 2016-11-23 DIAGNOSIS — R7989 Other specified abnormal findings of blood chemistry: Secondary | ICD-10-CM

## 2016-11-26 ENCOUNTER — Other Ambulatory Visit: Payer: Self-pay | Admitting: Internal Medicine

## 2016-11-26 DIAGNOSIS — E041 Nontoxic single thyroid nodule: Secondary | ICD-10-CM

## 2016-12-01 ENCOUNTER — Other Ambulatory Visit (HOSPITAL_COMMUNITY)
Admission: RE | Admit: 2016-12-01 | Discharge: 2016-12-01 | Disposition: A | Discharge status: Home / Self Care | Payer: 59 | Source: Ambulatory Visit | Attending: Radiology | Admitting: Radiology

## 2016-12-01 ENCOUNTER — Ambulatory Visit
Admission: RE | Admit: 2016-12-01 | Discharge: 2016-12-01 | Disposition: A | Discharge status: Home / Self Care | Payer: 59 | Source: Ambulatory Visit | Attending: Internal Medicine | Admitting: Internal Medicine

## 2016-12-01 DIAGNOSIS — E041 Nontoxic single thyroid nodule: Secondary | ICD-10-CM | POA: Insufficient documentation

## 2016-12-08 NOTE — Progress Notes (Signed)
HPI patient states her right foot is doing well with diminished discomfort    ROS      Objective:  Physical Exam neurovascular status intact with patient's osteotomy right healing well with wound edges well coapted joint congruence and negative Homans sign noted with good range of motion     Assessment:   Doing well post osteotomy first metatarsal right      Plan:   X-ray reviewed and at this point patient can gradually increase activity levels. Patient will continue surgical shoe usage and will be seen back to recheck 4 weeks or earlier if necessary with anklet given to patient today  X-ray indicates that the osteotomy is healing well with pins in place joint congruence no indications of pathology

## 2017-02-23 DIAGNOSIS — L700 Acne vulgaris: Secondary | ICD-10-CM | POA: Diagnosis not present

## 2017-02-23 DIAGNOSIS — D224 Melanocytic nevi of scalp and neck: Secondary | ICD-10-CM | POA: Diagnosis not present

## 2017-02-23 DIAGNOSIS — L821 Other seborrheic keratosis: Secondary | ICD-10-CM | POA: Diagnosis not present

## 2017-02-25 DIAGNOSIS — J301 Allergic rhinitis due to pollen: Secondary | ICD-10-CM | POA: Diagnosis not present

## 2017-02-25 DIAGNOSIS — J3081 Allergic rhinitis due to animal (cat) (dog) hair and dander: Secondary | ICD-10-CM | POA: Diagnosis not present

## 2017-02-25 DIAGNOSIS — J3089 Other allergic rhinitis: Secondary | ICD-10-CM | POA: Diagnosis not present

## 2017-03-15 ENCOUNTER — Other Ambulatory Visit: Payer: Self-pay | Admitting: Internal Medicine

## 2017-03-15 ENCOUNTER — Ambulatory Visit
Admission: RE | Admit: 2017-03-15 | Discharge: 2017-03-15 | Disposition: A | Discharge status: Home / Self Care | Payer: 59 | Source: Ambulatory Visit | Attending: Internal Medicine | Admitting: Internal Medicine

## 2017-03-15 DIAGNOSIS — J45901 Unspecified asthma with (acute) exacerbation: Secondary | ICD-10-CM

## 2017-03-15 DIAGNOSIS — R05 Cough: Secondary | ICD-10-CM | POA: Diagnosis not present

## 2017-03-15 DIAGNOSIS — J209 Acute bronchitis, unspecified: Secondary | ICD-10-CM | POA: Diagnosis not present

## 2017-03-17 DIAGNOSIS — J45909 Unspecified asthma, uncomplicated: Secondary | ICD-10-CM | POA: Diagnosis not present

## 2017-03-17 DIAGNOSIS — J209 Acute bronchitis, unspecified: Secondary | ICD-10-CM | POA: Diagnosis not present

## 2017-03-29 DIAGNOSIS — Z23 Encounter for immunization: Secondary | ICD-10-CM | POA: Diagnosis not present

## 2017-04-08 DIAGNOSIS — J069 Acute upper respiratory infection, unspecified: Secondary | ICD-10-CM | POA: Diagnosis not present

## 2017-05-11 DIAGNOSIS — J3081 Allergic rhinitis due to animal (cat) (dog) hair and dander: Secondary | ICD-10-CM | POA: Diagnosis not present

## 2017-05-11 DIAGNOSIS — J3089 Other allergic rhinitis: Secondary | ICD-10-CM | POA: Diagnosis not present

## 2017-05-11 DIAGNOSIS — J301 Allergic rhinitis due to pollen: Secondary | ICD-10-CM | POA: Diagnosis not present

## 2017-05-16 DIAGNOSIS — J3081 Allergic rhinitis due to animal (cat) (dog) hair and dander: Secondary | ICD-10-CM | POA: Diagnosis not present

## 2017-05-16 DIAGNOSIS — J3089 Other allergic rhinitis: Secondary | ICD-10-CM | POA: Diagnosis not present

## 2017-05-16 DIAGNOSIS — J301 Allergic rhinitis due to pollen: Secondary | ICD-10-CM | POA: Diagnosis not present

## 2017-05-18 DIAGNOSIS — J4540 Moderate persistent asthma, uncomplicated: Secondary | ICD-10-CM | POA: Diagnosis not present

## 2017-05-18 DIAGNOSIS — L209 Atopic dermatitis, unspecified: Secondary | ICD-10-CM | POA: Diagnosis not present

## 2017-05-18 DIAGNOSIS — J3089 Other allergic rhinitis: Secondary | ICD-10-CM | POA: Diagnosis not present

## 2017-05-19 ENCOUNTER — Encounter: Payer: 59 | Admitting: Podiatry

## 2017-05-20 DIAGNOSIS — J3089 Other allergic rhinitis: Secondary | ICD-10-CM | POA: Diagnosis not present

## 2017-05-20 DIAGNOSIS — J3081 Allergic rhinitis due to animal (cat) (dog) hair and dander: Secondary | ICD-10-CM | POA: Diagnosis not present

## 2017-05-20 DIAGNOSIS — J301 Allergic rhinitis due to pollen: Secondary | ICD-10-CM | POA: Diagnosis not present

## 2017-05-27 ENCOUNTER — Other Ambulatory Visit: Payer: Self-pay | Admitting: Neurology

## 2017-05-27 DIAGNOSIS — M84374A Stress fracture, right foot, initial encounter for fracture: Secondary | ICD-10-CM | POA: Diagnosis not present

## 2017-05-31 DIAGNOSIS — J301 Allergic rhinitis due to pollen: Secondary | ICD-10-CM | POA: Diagnosis not present

## 2017-05-31 DIAGNOSIS — J3089 Other allergic rhinitis: Secondary | ICD-10-CM | POA: Diagnosis not present

## 2017-05-31 DIAGNOSIS — J3081 Allergic rhinitis due to animal (cat) (dog) hair and dander: Secondary | ICD-10-CM | POA: Diagnosis not present

## 2017-06-02 DIAGNOSIS — J3089 Other allergic rhinitis: Secondary | ICD-10-CM | POA: Diagnosis not present

## 2017-06-02 DIAGNOSIS — J301 Allergic rhinitis due to pollen: Secondary | ICD-10-CM | POA: Diagnosis not present

## 2017-06-02 DIAGNOSIS — J3081 Allergic rhinitis due to animal (cat) (dog) hair and dander: Secondary | ICD-10-CM | POA: Diagnosis not present

## 2017-06-08 DIAGNOSIS — J301 Allergic rhinitis due to pollen: Secondary | ICD-10-CM | POA: Diagnosis not present

## 2017-06-08 DIAGNOSIS — J3089 Other allergic rhinitis: Secondary | ICD-10-CM | POA: Diagnosis not present

## 2017-06-08 DIAGNOSIS — J3081 Allergic rhinitis due to animal (cat) (dog) hair and dander: Secondary | ICD-10-CM | POA: Diagnosis not present

## 2017-06-09 DIAGNOSIS — M84374D Stress fracture, right foot, subsequent encounter for fracture with routine healing: Secondary | ICD-10-CM | POA: Diagnosis not present

## 2017-06-14 DIAGNOSIS — M8589 Other specified disorders of bone density and structure, multiple sites: Secondary | ICD-10-CM | POA: Diagnosis not present

## 2017-06-14 DIAGNOSIS — Z780 Asymptomatic menopausal state: Secondary | ICD-10-CM | POA: Diagnosis not present

## 2017-06-14 DIAGNOSIS — J301 Allergic rhinitis due to pollen: Secondary | ICD-10-CM | POA: Diagnosis not present

## 2017-06-14 DIAGNOSIS — J3089 Other allergic rhinitis: Secondary | ICD-10-CM | POA: Diagnosis not present

## 2017-06-14 DIAGNOSIS — J3081 Allergic rhinitis due to animal (cat) (dog) hair and dander: Secondary | ICD-10-CM | POA: Diagnosis not present

## 2017-06-17 ENCOUNTER — Encounter: Payer: Self-pay | Admitting: Neurology

## 2017-06-20 DIAGNOSIS — J301 Allergic rhinitis due to pollen: Secondary | ICD-10-CM | POA: Diagnosis not present

## 2017-06-20 DIAGNOSIS — J3089 Other allergic rhinitis: Secondary | ICD-10-CM | POA: Diagnosis not present

## 2017-06-20 DIAGNOSIS — J3081 Allergic rhinitis due to animal (cat) (dog) hair and dander: Secondary | ICD-10-CM | POA: Diagnosis not present

## 2017-07-04 DIAGNOSIS — J301 Allergic rhinitis due to pollen: Secondary | ICD-10-CM | POA: Diagnosis not present

## 2017-07-04 DIAGNOSIS — L209 Atopic dermatitis, unspecified: Secondary | ICD-10-CM | POA: Diagnosis not present

## 2017-07-04 DIAGNOSIS — J4540 Moderate persistent asthma, uncomplicated: Secondary | ICD-10-CM | POA: Diagnosis not present

## 2017-07-04 DIAGNOSIS — J3089 Other allergic rhinitis: Secondary | ICD-10-CM | POA: Diagnosis not present

## 2017-07-04 DIAGNOSIS — J209 Acute bronchitis, unspecified: Secondary | ICD-10-CM | POA: Diagnosis not present

## 2017-07-06 DIAGNOSIS — H019 Unspecified inflammation of eyelid: Secondary | ICD-10-CM | POA: Diagnosis not present

## 2017-07-06 DIAGNOSIS — J45909 Unspecified asthma, uncomplicated: Secondary | ICD-10-CM | POA: Diagnosis not present

## 2017-07-07 NOTE — Progress Notes (Signed)
This encounter was created in error - please disregard.

## 2017-07-08 DIAGNOSIS — M84374D Stress fracture, right foot, subsequent encounter for fracture with routine healing: Secondary | ICD-10-CM | POA: Diagnosis not present

## 2017-07-11 DIAGNOSIS — J4540 Moderate persistent asthma, uncomplicated: Secondary | ICD-10-CM | POA: Diagnosis not present

## 2017-07-12 DIAGNOSIS — J3081 Allergic rhinitis due to animal (cat) (dog) hair and dander: Secondary | ICD-10-CM | POA: Diagnosis not present

## 2017-07-12 DIAGNOSIS — J3089 Other allergic rhinitis: Secondary | ICD-10-CM | POA: Diagnosis not present

## 2017-07-12 DIAGNOSIS — J301 Allergic rhinitis due to pollen: Secondary | ICD-10-CM | POA: Diagnosis not present

## 2017-07-14 DIAGNOSIS — J301 Allergic rhinitis due to pollen: Secondary | ICD-10-CM | POA: Diagnosis not present

## 2017-07-14 DIAGNOSIS — J3089 Other allergic rhinitis: Secondary | ICD-10-CM | POA: Diagnosis not present

## 2017-07-14 DIAGNOSIS — J3081 Allergic rhinitis due to animal (cat) (dog) hair and dander: Secondary | ICD-10-CM | POA: Diagnosis not present

## 2017-07-19 DIAGNOSIS — J301 Allergic rhinitis due to pollen: Secondary | ICD-10-CM | POA: Diagnosis not present

## 2017-07-19 DIAGNOSIS — J3089 Other allergic rhinitis: Secondary | ICD-10-CM | POA: Diagnosis not present

## 2017-07-19 DIAGNOSIS — J3081 Allergic rhinitis due to animal (cat) (dog) hair and dander: Secondary | ICD-10-CM | POA: Diagnosis not present

## 2017-07-21 DIAGNOSIS — J301 Allergic rhinitis due to pollen: Secondary | ICD-10-CM | POA: Diagnosis not present

## 2017-07-21 DIAGNOSIS — J3089 Other allergic rhinitis: Secondary | ICD-10-CM | POA: Diagnosis not present

## 2017-07-21 DIAGNOSIS — J3081 Allergic rhinitis due to animal (cat) (dog) hair and dander: Secondary | ICD-10-CM | POA: Diagnosis not present

## 2017-07-22 DIAGNOSIS — J3089 Other allergic rhinitis: Secondary | ICD-10-CM | POA: Diagnosis not present

## 2017-07-26 DIAGNOSIS — J301 Allergic rhinitis due to pollen: Secondary | ICD-10-CM | POA: Diagnosis not present

## 2017-07-26 DIAGNOSIS — J3081 Allergic rhinitis due to animal (cat) (dog) hair and dander: Secondary | ICD-10-CM | POA: Diagnosis not present

## 2017-07-26 DIAGNOSIS — D485 Neoplasm of uncertain behavior of skin: Secondary | ICD-10-CM | POA: Diagnosis not present

## 2017-07-26 DIAGNOSIS — J3089 Other allergic rhinitis: Secondary | ICD-10-CM | POA: Diagnosis not present

## 2017-07-26 DIAGNOSIS — L739 Follicular disorder, unspecified: Secondary | ICD-10-CM | POA: Diagnosis not present

## 2017-08-01 DIAGNOSIS — J3089 Other allergic rhinitis: Secondary | ICD-10-CM | POA: Diagnosis not present

## 2017-08-01 DIAGNOSIS — J3081 Allergic rhinitis due to animal (cat) (dog) hair and dander: Secondary | ICD-10-CM | POA: Diagnosis not present

## 2017-08-01 DIAGNOSIS — J301 Allergic rhinitis due to pollen: Secondary | ICD-10-CM | POA: Diagnosis not present

## 2017-08-05 DIAGNOSIS — J3089 Other allergic rhinitis: Secondary | ICD-10-CM | POA: Diagnosis not present

## 2017-08-05 DIAGNOSIS — J301 Allergic rhinitis due to pollen: Secondary | ICD-10-CM | POA: Diagnosis not present

## 2017-08-05 DIAGNOSIS — J3081 Allergic rhinitis due to animal (cat) (dog) hair and dander: Secondary | ICD-10-CM | POA: Diagnosis not present

## 2017-08-08 DIAGNOSIS — J3081 Allergic rhinitis due to animal (cat) (dog) hair and dander: Secondary | ICD-10-CM | POA: Diagnosis not present

## 2017-08-08 DIAGNOSIS — J3089 Other allergic rhinitis: Secondary | ICD-10-CM | POA: Diagnosis not present

## 2017-08-08 DIAGNOSIS — J301 Allergic rhinitis due to pollen: Secondary | ICD-10-CM | POA: Diagnosis not present

## 2017-08-10 DIAGNOSIS — D2261 Melanocytic nevi of right upper limb, including shoulder: Secondary | ICD-10-CM | POA: Diagnosis not present

## 2017-08-10 DIAGNOSIS — D225 Melanocytic nevi of trunk: Secondary | ICD-10-CM | POA: Diagnosis not present

## 2017-08-10 DIAGNOSIS — L570 Actinic keratosis: Secondary | ICD-10-CM | POA: Diagnosis not present

## 2017-08-10 DIAGNOSIS — D2230 Melanocytic nevi of unspecified part of face: Secondary | ICD-10-CM | POA: Diagnosis not present

## 2017-08-10 DIAGNOSIS — L820 Inflamed seborrheic keratosis: Secondary | ICD-10-CM | POA: Diagnosis not present

## 2017-08-10 DIAGNOSIS — D485 Neoplasm of uncertain behavior of skin: Secondary | ICD-10-CM | POA: Diagnosis not present

## 2017-08-11 DIAGNOSIS — J301 Allergic rhinitis due to pollen: Secondary | ICD-10-CM | POA: Diagnosis not present

## 2017-08-11 DIAGNOSIS — J3081 Allergic rhinitis due to animal (cat) (dog) hair and dander: Secondary | ICD-10-CM | POA: Diagnosis not present

## 2017-08-11 DIAGNOSIS — J3089 Other allergic rhinitis: Secondary | ICD-10-CM | POA: Diagnosis not present

## 2017-08-15 DIAGNOSIS — J3081 Allergic rhinitis due to animal (cat) (dog) hair and dander: Secondary | ICD-10-CM | POA: Diagnosis not present

## 2017-08-15 DIAGNOSIS — J301 Allergic rhinitis due to pollen: Secondary | ICD-10-CM | POA: Diagnosis not present

## 2017-08-15 DIAGNOSIS — J3089 Other allergic rhinitis: Secondary | ICD-10-CM | POA: Diagnosis not present

## 2017-08-17 DIAGNOSIS — E039 Hypothyroidism, unspecified: Secondary | ICD-10-CM | POA: Diagnosis not present

## 2017-08-22 DIAGNOSIS — J3081 Allergic rhinitis due to animal (cat) (dog) hair and dander: Secondary | ICD-10-CM | POA: Diagnosis not present

## 2017-08-22 DIAGNOSIS — J301 Allergic rhinitis due to pollen: Secondary | ICD-10-CM | POA: Diagnosis not present

## 2017-08-22 DIAGNOSIS — J3089 Other allergic rhinitis: Secondary | ICD-10-CM | POA: Diagnosis not present

## 2017-08-29 DIAGNOSIS — J3089 Other allergic rhinitis: Secondary | ICD-10-CM | POA: Diagnosis not present

## 2017-08-29 DIAGNOSIS — J3081 Allergic rhinitis due to animal (cat) (dog) hair and dander: Secondary | ICD-10-CM | POA: Diagnosis not present

## 2017-08-29 DIAGNOSIS — J301 Allergic rhinitis due to pollen: Secondary | ICD-10-CM | POA: Diagnosis not present

## 2017-09-01 DIAGNOSIS — Z800 Family history of malignant neoplasm of digestive organs: Secondary | ICD-10-CM | POA: Diagnosis not present

## 2017-09-01 DIAGNOSIS — K582 Mixed irritable bowel syndrome: Secondary | ICD-10-CM | POA: Diagnosis not present

## 2017-09-06 DIAGNOSIS — J3089 Other allergic rhinitis: Secondary | ICD-10-CM | POA: Diagnosis not present

## 2017-09-06 DIAGNOSIS — J3081 Allergic rhinitis due to animal (cat) (dog) hair and dander: Secondary | ICD-10-CM | POA: Diagnosis not present

## 2017-09-06 DIAGNOSIS — J301 Allergic rhinitis due to pollen: Secondary | ICD-10-CM | POA: Diagnosis not present

## 2017-09-08 DIAGNOSIS — J301 Allergic rhinitis due to pollen: Secondary | ICD-10-CM | POA: Diagnosis not present

## 2017-09-08 DIAGNOSIS — J3081 Allergic rhinitis due to animal (cat) (dog) hair and dander: Secondary | ICD-10-CM | POA: Diagnosis not present

## 2017-09-13 DIAGNOSIS — J3081 Allergic rhinitis due to animal (cat) (dog) hair and dander: Secondary | ICD-10-CM | POA: Diagnosis not present

## 2017-09-13 DIAGNOSIS — J3089 Other allergic rhinitis: Secondary | ICD-10-CM | POA: Diagnosis not present

## 2017-09-13 DIAGNOSIS — J301 Allergic rhinitis due to pollen: Secondary | ICD-10-CM | POA: Diagnosis not present

## 2017-09-14 DIAGNOSIS — K317 Polyp of stomach and duodenum: Secondary | ICD-10-CM | POA: Diagnosis not present

## 2017-09-14 DIAGNOSIS — Z1211 Encounter for screening for malignant neoplasm of colon: Secondary | ICD-10-CM | POA: Diagnosis not present

## 2017-09-14 DIAGNOSIS — R1033 Periumbilical pain: Secondary | ICD-10-CM | POA: Diagnosis not present

## 2017-09-14 DIAGNOSIS — K219 Gastro-esophageal reflux disease without esophagitis: Secondary | ICD-10-CM | POA: Diagnosis not present

## 2017-09-14 DIAGNOSIS — Z800 Family history of malignant neoplasm of digestive organs: Secondary | ICD-10-CM | POA: Diagnosis not present

## 2017-09-15 DIAGNOSIS — Z01419 Encounter for gynecological examination (general) (routine) without abnormal findings: Secondary | ICD-10-CM | POA: Diagnosis not present

## 2017-09-15 DIAGNOSIS — Z1231 Encounter for screening mammogram for malignant neoplasm of breast: Secondary | ICD-10-CM | POA: Diagnosis not present

## 2017-09-19 ENCOUNTER — Other Ambulatory Visit: Payer: Self-pay

## 2017-09-19 ENCOUNTER — Encounter: Payer: Self-pay | Admitting: Neurology

## 2017-09-19 ENCOUNTER — Ambulatory Visit (INDEPENDENT_AMBULATORY_CARE_PROVIDER_SITE_OTHER): Payer: 59 | Admitting: Neurology

## 2017-09-19 VITALS — BP 108/76 | HR 64 | Ht 62.25 in | Wt 147.0 lb

## 2017-09-19 DIAGNOSIS — G4485 Primary stabbing headache: Secondary | ICD-10-CM

## 2017-09-19 DIAGNOSIS — M792 Neuralgia and neuritis, unspecified: Secondary | ICD-10-CM

## 2017-09-19 DIAGNOSIS — G43109 Migraine with aura, not intractable, without status migrainosus: Secondary | ICD-10-CM | POA: Diagnosis not present

## 2017-09-19 DIAGNOSIS — G43809 Other migraine, not intractable, without status migrainosus: Secondary | ICD-10-CM

## 2017-09-19 NOTE — Progress Notes (Signed)
NEUROLOGY FOLLOW UP OFFICE NOTE  Katie Oneill [id number]  HISTORY OF PRESENT ILLNESS: Katie Oneill is a 57 year old right-handed woman with history of hypothyroidism and asthma who follows up for vestibular migraine, stabbing headache and thigh pain.   UPDATE: She is taking gabapentin 600mg  at bedtime.   No vestibular migraines over past year.  She gets a primary stabbing headache maybe once a month at the most, brief but followed by a mild dull headache the next day lasting a couple of hours and rarely up to a day.  She treats with Tylenol.   Neuralgia of thigh controlled.   Depression/anxiety:  No Sleep hygiene:  Variable   HISTORY: In November 2014, she started experiencing episodes of headache associated with slowed processing, clumsiness of her right hand, difficulty getting words out and unsteady gait, possibly veering towards the right.  The headaches were described as bi-temporal aching or stabbing pain, anywhere from 3 to 7/10.  For a period of 6 weeks, she was getting them almost daily.  She reportedly had an MRI of the brain performed 02/13/13, which was unremarkable.  She had an MRI of the brain without contrast performed on 02/28/13, which was reviewed and was normal.  Carotid duplex performed 03/28/13 revealed no hemodynamically significant stenosis.  MRA of the head was performed on 03/29/13, which was personally reviewed and revealed focal area of decreased signal in the left ICA, thought to be artifact.  Blood work was performed on 03/16/13, which showed ANA negative, Sed Rate 3, and ACE 47.  Symptoms had resolved and migraine was suspected. She was advised to start ASA 81mg  daily.   Starting in September 2015, she began experiencing similar episodes.  At that time, she woke up one morning with headache, spinning sensation and nausea.  She fell back to sleep and when she woke up 2 hours later, it had resolved.  About 4 weeks ago, she developed another episode of dizziness, associated  with headache. She also reported slight clumsiness of the right hand.  During an episode, it is more difficult to write.  It lasted 5 days.     She was evaluated for dizziness by Dr. Constance Holster, ENT.  She was experiencing vertigo and imbalance with aural fullness and possible hearing loss.  Exam was normal.       Personal history of headache:  No but when she was 14 or 15, she was experiencing similar episodes of dizziness. Family history of headache:  no  PAST MEDICAL HISTORY: Past Medical History:  Diagnosis Date  . Asthma   . GERD (gastroesophageal reflux disease)   . Gout   . Headache(784.0) 03/15/2013  . Hypothyroid   . IBS (irritable bowel syndrome)     MEDICATIONS: Current Outpatient Medications on File Prior to Visit  Medication Sig Dispense Refill  . ranitidine (ZANTAC) 150 MG tablet Take 150 mg by mouth 2 (two) times daily.    Marland Kitchen acetaminophen (TYLENOL) 325 MG tablet Take 2 tablets (650 mg total) by mouth every 6 (six) hours as needed for mild pain or moderate pain. 30 tablet 1  . BREO ELLIPTA 200-25 MCG/INH AEPB Take 1 puff by mouth daily.    Marland Kitchen EPIPEN 2-PAK 0.3 MG/0.3ML SOAJ injection See admin instructions.  1  . gabapentin (NEURONTIN) 300 MG capsule TAKE 2 CAPSULES (600 MG TOTAL) BY MOUTH AT BEDTIME. 180 capsule 1  . levothyroxine (SYNTHROID, LEVOTHROID) 75 MCG tablet Take 75 mcg by mouth every other day.    . levothyroxine (  SYNTHROID, LEVOTHROID) 88 MCG tablet Take 88 mcg by mouth daily before breakfast.    . LORazepam (ATIVAN) 0.5 MG tablet Take 1 tablet (0.5 mg total) by mouth 2 (two) times daily as needed for anxiety (usually only uses for flight). 15 tablet 1  . meclizine (ANTIVERT) 25 MG tablet Take 1 tablet (25 mg total) by mouth 3 (three) times daily as needed for dizziness. 30 tablet 1  . ondansetron (ZOFRAN) 4 MG tablet Take 4 mg by mouth every 8 (eight) hours as needed for nausea or vomiting (Take 1 tablet by mouth every eight hours as needed for nausea).    .  pantoprazole (PROTONIX) 40 MG tablet Take 40 mg by mouth daily.    Marland Kitchen PREMARIN vaginal cream     . Probiotic Product (PROBIOTIC ADVANCED PO) Take by mouth daily.    Penne Lash HFA 45 MCG/ACT inhaler Inhale 2 puffs into the lungs as needed (SOB).      No current facility-administered medications on file prior to visit.     ALLERGIES: Allergies  Allergen Reactions  . Shellfish Allergy Nausea And Vomiting  . Penicillins Hives  . Sulfa Antibiotics Hives    FAMILY HISTORY: Family History  Problem Relation Age of Onset  . Heart disease Mother   . Heart disease Father   . Melanoma Father   . Heart disease Maternal Grandmother   . Cancer Maternal Grandfather        brain cancer  . Stroke Paternal Grandfather   . Cancer Paternal Grandmother        Breast cancer     SOCIAL HISTORY: Social History   Socioeconomic History  . Marital status: Married    Spouse name: Not on file  . Number of children: 3  . Years of education: post grad  . Highest education level: Not on file  Occupational History  . 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 Financial resource strain: Not on file  . Food insecurity:    Worry: Not on file    Inability: Not on file  . Transportation needs:    Medical: Not on file    Non-medical: Not on file  Tobacco Use  . Smoking status: Never Smoker  . Smokeless tobacco: Never Used  Substance and Sexual Activity  . Alcohol use: Yes    Alcohol/week: 0.6 oz    Types: 1 Standard drinks or equivalent per week    Comment: rarely  . Drug use: No  . Sexual activity: Never    Partners: Male  Lifestyle  . Physical activity:    Days per week: Not on file    Minutes per session: Not on file  . Stress: Not on file  Relationships  . Social connections:    Talks on phone: Not on file    Gets together: Not on file    Attends religious service: Not on file    Active member of club or organization: Not on  file    Attends meetings of clubs or organizations: Not on file    Relationship status: Not on file  . Intimate partner violence:    Fear of current or ex partner: Not on file    Emotionally abused: Not on file    Physically abused: Not on file    Forced sexual activity: Not on file  Other Topics Concern  . Not on file  Social History Narrative  . Not on  file    REVIEW OF SYSTEMS: Constitutional: No fevers, chills, or sweats, no generalized fatigue, change in appetite Eyes: No visual changes, double vision, eye pain Ear, nose and throat: No hearing loss, ear pain, nasal congestion, sore throat Cardiovascular: No chest pain, palpitations Respiratory:  No shortness of breath at rest or with exertion, wheezes GastrointestinaI: No nausea, vomiting, diarrhea, abdominal pain, fecal incontinence Genitourinary:  No dysuria, urinary retention or frequency Musculoskeletal:  No neck pain, back pain Integumentary: No rash, pruritus, skin lesions Neurological: as above Psychiatric: No depression, insomnia, anxiety Endocrine: No palpitations, fatigue, diaphoresis, mood swings, change in appetite, change in weight, increased thirst Hematologic/Lymphatic:  No purpura, petechiae. Allergic/Immunologic: no itchy/runny eyes, nasal congestion, recent allergic reactions, rashes  PHYSICAL EXAM: Vitals:   09/19/17 0753  BP: 108/76  Pulse: 64  SpO2: 98%   General: No acute distress.  Patient appears well-groomed.   Head:  Normocephalic/atraumatic Eyes:  Fundi examined but not visualized Neck: supple, no paraspinal tenderness, full range of motion Heart:  Regular rate and rhythm Lungs:  Clear to auscultation bilaterally Back: No paraspinal tenderness Neurological Exam: alert and oriented to person, place, and time. Attention span and concentration intact, recent and remote memory intact, fund of knowledge intact.  Speech fluent and not dysarthric, language intact.  CN II-XII intact. Bulk and tone  normal, muscle strength 5/5 throughout.  Sensation to light touch intact.  Deep tendon reflexes 2+ throughout, toes downgoing.  Finger to nose and heel to shin testing intact.  Gait normal, Romberg negative.  IMPRESSION: Migraine with brainstem aura stable Primary stabbing headache stable Neuralgia, stable  PLAN: 1.  Gabapentin 600mg  at bedtime 2.  Tylenol as needed, limited to no more than 2 days out of week to prevent rebound headache 3.  Headache diary 4.  Follow up in one year or as needed  Metta Clines, DO  CC:  Dr. Coralyn Mark

## 2017-09-19 NOTE — Patient Instructions (Addendum)
1.  Gabapentin 600mg  at bedtime 2.  Tylenol as needed, limited to no more than 2 days out of week to prevent rebound headache 3.  Headache diary 4.  Follow up in one year or as needed

## 2017-09-20 DIAGNOSIS — J3081 Allergic rhinitis due to animal (cat) (dog) hair and dander: Secondary | ICD-10-CM | POA: Diagnosis not present

## 2017-09-20 DIAGNOSIS — J3089 Other allergic rhinitis: Secondary | ICD-10-CM | POA: Diagnosis not present

## 2017-09-20 DIAGNOSIS — J301 Allergic rhinitis due to pollen: Secondary | ICD-10-CM | POA: Diagnosis not present

## 2017-09-30 DIAGNOSIS — J3081 Allergic rhinitis due to animal (cat) (dog) hair and dander: Secondary | ICD-10-CM | POA: Diagnosis not present

## 2017-09-30 DIAGNOSIS — J3089 Other allergic rhinitis: Secondary | ICD-10-CM | POA: Diagnosis not present

## 2017-09-30 DIAGNOSIS — J301 Allergic rhinitis due to pollen: Secondary | ICD-10-CM | POA: Diagnosis not present

## 2017-10-11 DIAGNOSIS — J3089 Other allergic rhinitis: Secondary | ICD-10-CM | POA: Diagnosis not present

## 2017-10-11 DIAGNOSIS — J301 Allergic rhinitis due to pollen: Secondary | ICD-10-CM | POA: Diagnosis not present

## 2017-10-11 DIAGNOSIS — J3081 Allergic rhinitis due to animal (cat) (dog) hair and dander: Secondary | ICD-10-CM | POA: Diagnosis not present

## 2017-10-19 DIAGNOSIS — J3081 Allergic rhinitis due to animal (cat) (dog) hair and dander: Secondary | ICD-10-CM | POA: Diagnosis not present

## 2017-10-19 DIAGNOSIS — J3089 Other allergic rhinitis: Secondary | ICD-10-CM | POA: Diagnosis not present

## 2017-10-19 DIAGNOSIS — J301 Allergic rhinitis due to pollen: Secondary | ICD-10-CM | POA: Diagnosis not present

## 2017-10-26 DIAGNOSIS — J3081 Allergic rhinitis due to animal (cat) (dog) hair and dander: Secondary | ICD-10-CM | POA: Diagnosis not present

## 2017-10-26 DIAGNOSIS — J301 Allergic rhinitis due to pollen: Secondary | ICD-10-CM | POA: Diagnosis not present

## 2017-10-26 DIAGNOSIS — J3089 Other allergic rhinitis: Secondary | ICD-10-CM | POA: Diagnosis not present

## 2017-11-02 DIAGNOSIS — J3089 Other allergic rhinitis: Secondary | ICD-10-CM | POA: Diagnosis not present

## 2017-11-02 DIAGNOSIS — J301 Allergic rhinitis due to pollen: Secondary | ICD-10-CM | POA: Diagnosis not present

## 2017-11-02 DIAGNOSIS — J3081 Allergic rhinitis due to animal (cat) (dog) hair and dander: Secondary | ICD-10-CM | POA: Diagnosis not present

## 2017-11-15 DIAGNOSIS — Z23 Encounter for immunization: Secondary | ICD-10-CM | POA: Diagnosis not present

## 2017-11-17 DIAGNOSIS — J3089 Other allergic rhinitis: Secondary | ICD-10-CM | POA: Diagnosis not present

## 2017-11-17 DIAGNOSIS — J301 Allergic rhinitis due to pollen: Secondary | ICD-10-CM | POA: Diagnosis not present

## 2017-11-17 DIAGNOSIS — J3081 Allergic rhinitis due to animal (cat) (dog) hair and dander: Secondary | ICD-10-CM | POA: Diagnosis not present

## 2017-11-25 ENCOUNTER — Other Ambulatory Visit: Payer: Self-pay | Admitting: Internal Medicine

## 2017-11-25 DIAGNOSIS — E041 Nontoxic single thyroid nodule: Secondary | ICD-10-CM

## 2017-11-25 DIAGNOSIS — Z8639 Personal history of other endocrine, nutritional and metabolic disease: Secondary | ICD-10-CM | POA: Diagnosis not present

## 2017-11-25 DIAGNOSIS — J4540 Moderate persistent asthma, uncomplicated: Secondary | ICD-10-CM | POA: Diagnosis not present

## 2017-11-25 DIAGNOSIS — J3089 Other allergic rhinitis: Secondary | ICD-10-CM | POA: Diagnosis not present

## 2017-11-25 DIAGNOSIS — J301 Allergic rhinitis due to pollen: Secondary | ICD-10-CM | POA: Diagnosis not present

## 2017-11-25 DIAGNOSIS — Z0000 Encounter for general adult medical examination without abnormal findings: Secondary | ICD-10-CM | POA: Diagnosis not present

## 2017-11-25 DIAGNOSIS — Z23 Encounter for immunization: Secondary | ICD-10-CM | POA: Diagnosis not present

## 2017-11-25 DIAGNOSIS — J3081 Allergic rhinitis due to animal (cat) (dog) hair and dander: Secondary | ICD-10-CM | POA: Diagnosis not present

## 2017-11-25 DIAGNOSIS — E039 Hypothyroidism, unspecified: Secondary | ICD-10-CM | POA: Diagnosis not present

## 2017-12-01 DIAGNOSIS — J3089 Other allergic rhinitis: Secondary | ICD-10-CM | POA: Diagnosis not present

## 2017-12-01 DIAGNOSIS — J301 Allergic rhinitis due to pollen: Secondary | ICD-10-CM | POA: Diagnosis not present

## 2017-12-01 DIAGNOSIS — J3081 Allergic rhinitis due to animal (cat) (dog) hair and dander: Secondary | ICD-10-CM | POA: Diagnosis not present

## 2017-12-04 ENCOUNTER — Other Ambulatory Visit: Payer: Self-pay | Admitting: Neurology

## 2017-12-05 ENCOUNTER — Ambulatory Visit
Admission: RE | Admit: 2017-12-05 | Discharge: 2017-12-05 | Disposition: A | Discharge status: Home / Self Care | Payer: 59 | Source: Ambulatory Visit | Attending: Internal Medicine | Admitting: Internal Medicine

## 2017-12-05 DIAGNOSIS — E041 Nontoxic single thyroid nodule: Secondary | ICD-10-CM | POA: Diagnosis not present

## 2017-12-06 DIAGNOSIS — J3089 Other allergic rhinitis: Secondary | ICD-10-CM | POA: Diagnosis not present

## 2017-12-06 DIAGNOSIS — J301 Allergic rhinitis due to pollen: Secondary | ICD-10-CM | POA: Diagnosis not present

## 2017-12-06 DIAGNOSIS — J3081 Allergic rhinitis due to animal (cat) (dog) hair and dander: Secondary | ICD-10-CM | POA: Diagnosis not present

## 2017-12-13 DIAGNOSIS — J3089 Other allergic rhinitis: Secondary | ICD-10-CM | POA: Diagnosis not present

## 2017-12-13 DIAGNOSIS — J3081 Allergic rhinitis due to animal (cat) (dog) hair and dander: Secondary | ICD-10-CM | POA: Diagnosis not present

## 2017-12-13 DIAGNOSIS — J301 Allergic rhinitis due to pollen: Secondary | ICD-10-CM | POA: Diagnosis not present

## 2017-12-19 DIAGNOSIS — J3081 Allergic rhinitis due to animal (cat) (dog) hair and dander: Secondary | ICD-10-CM | POA: Diagnosis not present

## 2017-12-19 DIAGNOSIS — J301 Allergic rhinitis due to pollen: Secondary | ICD-10-CM | POA: Diagnosis not present

## 2017-12-20 DIAGNOSIS — J3089 Other allergic rhinitis: Secondary | ICD-10-CM | POA: Diagnosis not present

## 2017-12-21 DIAGNOSIS — J3081 Allergic rhinitis due to animal (cat) (dog) hair and dander: Secondary | ICD-10-CM | POA: Diagnosis not present

## 2017-12-21 DIAGNOSIS — J3089 Other allergic rhinitis: Secondary | ICD-10-CM | POA: Diagnosis not present

## 2017-12-21 DIAGNOSIS — J301 Allergic rhinitis due to pollen: Secondary | ICD-10-CM | POA: Diagnosis not present

## 2017-12-27 DIAGNOSIS — J301 Allergic rhinitis due to pollen: Secondary | ICD-10-CM | POA: Diagnosis not present

## 2017-12-27 DIAGNOSIS — J3081 Allergic rhinitis due to animal (cat) (dog) hair and dander: Secondary | ICD-10-CM | POA: Diagnosis not present

## 2017-12-27 DIAGNOSIS — J3089 Other allergic rhinitis: Secondary | ICD-10-CM | POA: Diagnosis not present

## 2017-12-28 DIAGNOSIS — Z119 Encounter for screening for infectious and parasitic diseases, unspecified: Secondary | ICD-10-CM | POA: Diagnosis not present

## 2018-01-06 DIAGNOSIS — Z23 Encounter for immunization: Secondary | ICD-10-CM | POA: Diagnosis not present

## 2018-01-06 DIAGNOSIS — J301 Allergic rhinitis due to pollen: Secondary | ICD-10-CM | POA: Diagnosis not present

## 2018-01-06 DIAGNOSIS — J3081 Allergic rhinitis due to animal (cat) (dog) hair and dander: Secondary | ICD-10-CM | POA: Diagnosis not present

## 2018-01-06 DIAGNOSIS — J3089 Other allergic rhinitis: Secondary | ICD-10-CM | POA: Diagnosis not present

## 2018-01-25 DIAGNOSIS — J3081 Allergic rhinitis due to animal (cat) (dog) hair and dander: Secondary | ICD-10-CM | POA: Diagnosis not present

## 2018-01-25 DIAGNOSIS — J301 Allergic rhinitis due to pollen: Secondary | ICD-10-CM | POA: Diagnosis not present

## 2018-01-25 DIAGNOSIS — J3089 Other allergic rhinitis: Secondary | ICD-10-CM | POA: Diagnosis not present

## 2018-01-30 DIAGNOSIS — J4540 Moderate persistent asthma, uncomplicated: Secondary | ICD-10-CM | POA: Diagnosis not present

## 2018-01-30 DIAGNOSIS — J3081 Allergic rhinitis due to animal (cat) (dog) hair and dander: Secondary | ICD-10-CM | POA: Diagnosis not present

## 2018-01-30 DIAGNOSIS — J3089 Other allergic rhinitis: Secondary | ICD-10-CM | POA: Diagnosis not present

## 2018-01-30 DIAGNOSIS — L209 Atopic dermatitis, unspecified: Secondary | ICD-10-CM | POA: Diagnosis not present

## 2018-01-30 DIAGNOSIS — J301 Allergic rhinitis due to pollen: Secondary | ICD-10-CM | POA: Diagnosis not present

## 2018-02-10 DIAGNOSIS — J301 Allergic rhinitis due to pollen: Secondary | ICD-10-CM | POA: Diagnosis not present

## 2018-02-10 DIAGNOSIS — J3081 Allergic rhinitis due to animal (cat) (dog) hair and dander: Secondary | ICD-10-CM | POA: Diagnosis not present

## 2018-02-10 DIAGNOSIS — J3089 Other allergic rhinitis: Secondary | ICD-10-CM | POA: Diagnosis not present

## 2018-02-21 DIAGNOSIS — J3089 Other allergic rhinitis: Secondary | ICD-10-CM | POA: Diagnosis not present

## 2018-02-21 DIAGNOSIS — J3081 Allergic rhinitis due to animal (cat) (dog) hair and dander: Secondary | ICD-10-CM | POA: Diagnosis not present

## 2018-02-21 DIAGNOSIS — J301 Allergic rhinitis due to pollen: Secondary | ICD-10-CM | POA: Diagnosis not present

## 2018-03-03 DIAGNOSIS — J3089 Other allergic rhinitis: Secondary | ICD-10-CM | POA: Diagnosis not present

## 2018-03-03 DIAGNOSIS — J301 Allergic rhinitis due to pollen: Secondary | ICD-10-CM | POA: Diagnosis not present

## 2018-03-03 DIAGNOSIS — J3081 Allergic rhinitis due to animal (cat) (dog) hair and dander: Secondary | ICD-10-CM | POA: Diagnosis not present

## 2018-03-06 DIAGNOSIS — J3089 Other allergic rhinitis: Secondary | ICD-10-CM | POA: Diagnosis not present

## 2018-03-06 DIAGNOSIS — J301 Allergic rhinitis due to pollen: Secondary | ICD-10-CM | POA: Diagnosis not present

## 2018-03-06 DIAGNOSIS — J3081 Allergic rhinitis due to animal (cat) (dog) hair and dander: Secondary | ICD-10-CM | POA: Diagnosis not present

## 2018-03-08 DIAGNOSIS — J301 Allergic rhinitis due to pollen: Secondary | ICD-10-CM | POA: Diagnosis not present

## 2018-03-08 DIAGNOSIS — J3089 Other allergic rhinitis: Secondary | ICD-10-CM | POA: Diagnosis not present

## 2018-03-08 DIAGNOSIS — J3081 Allergic rhinitis due to animal (cat) (dog) hair and dander: Secondary | ICD-10-CM | POA: Diagnosis not present

## 2018-03-14 DIAGNOSIS — J301 Allergic rhinitis due to pollen: Secondary | ICD-10-CM | POA: Diagnosis not present

## 2018-03-14 DIAGNOSIS — J3089 Other allergic rhinitis: Secondary | ICD-10-CM | POA: Diagnosis not present

## 2018-03-14 DIAGNOSIS — J3081 Allergic rhinitis due to animal (cat) (dog) hair and dander: Secondary | ICD-10-CM | POA: Diagnosis not present

## 2018-03-21 DIAGNOSIS — J3081 Allergic rhinitis due to animal (cat) (dog) hair and dander: Secondary | ICD-10-CM | POA: Diagnosis not present

## 2018-03-21 DIAGNOSIS — J301 Allergic rhinitis due to pollen: Secondary | ICD-10-CM | POA: Diagnosis not present

## 2018-03-21 DIAGNOSIS — J3089 Other allergic rhinitis: Secondary | ICD-10-CM | POA: Diagnosis not present

## 2018-04-07 DIAGNOSIS — J3081 Allergic rhinitis due to animal (cat) (dog) hair and dander: Secondary | ICD-10-CM | POA: Diagnosis not present

## 2018-04-07 DIAGNOSIS — J301 Allergic rhinitis due to pollen: Secondary | ICD-10-CM | POA: Diagnosis not present

## 2018-04-07 DIAGNOSIS — J3089 Other allergic rhinitis: Secondary | ICD-10-CM | POA: Diagnosis not present

## 2018-04-14 DIAGNOSIS — J301 Allergic rhinitis due to pollen: Secondary | ICD-10-CM | POA: Diagnosis not present

## 2018-04-14 DIAGNOSIS — J3089 Other allergic rhinitis: Secondary | ICD-10-CM | POA: Diagnosis not present

## 2018-04-14 DIAGNOSIS — J3081 Allergic rhinitis due to animal (cat) (dog) hair and dander: Secondary | ICD-10-CM | POA: Diagnosis not present

## 2018-04-21 DIAGNOSIS — J301 Allergic rhinitis due to pollen: Secondary | ICD-10-CM | POA: Diagnosis not present

## 2018-04-21 DIAGNOSIS — J3089 Other allergic rhinitis: Secondary | ICD-10-CM | POA: Diagnosis not present

## 2018-04-21 DIAGNOSIS — J3081 Allergic rhinitis due to animal (cat) (dog) hair and dander: Secondary | ICD-10-CM | POA: Diagnosis not present

## 2018-04-28 DIAGNOSIS — J3081 Allergic rhinitis due to animal (cat) (dog) hair and dander: Secondary | ICD-10-CM | POA: Diagnosis not present

## 2018-04-28 DIAGNOSIS — J301 Allergic rhinitis due to pollen: Secondary | ICD-10-CM | POA: Diagnosis not present

## 2018-04-28 DIAGNOSIS — J3089 Other allergic rhinitis: Secondary | ICD-10-CM | POA: Diagnosis not present

## 2018-05-05 DIAGNOSIS — J301 Allergic rhinitis due to pollen: Secondary | ICD-10-CM | POA: Diagnosis not present

## 2018-05-05 DIAGNOSIS — J3089 Other allergic rhinitis: Secondary | ICD-10-CM | POA: Diagnosis not present

## 2018-05-05 DIAGNOSIS — J3081 Allergic rhinitis due to animal (cat) (dog) hair and dander: Secondary | ICD-10-CM | POA: Diagnosis not present

## 2018-05-16 DIAGNOSIS — J3081 Allergic rhinitis due to animal (cat) (dog) hair and dander: Secondary | ICD-10-CM | POA: Diagnosis not present

## 2018-05-16 DIAGNOSIS — J3089 Other allergic rhinitis: Secondary | ICD-10-CM | POA: Diagnosis not present

## 2018-05-16 DIAGNOSIS — J301 Allergic rhinitis due to pollen: Secondary | ICD-10-CM | POA: Diagnosis not present

## 2018-05-23 DIAGNOSIS — J301 Allergic rhinitis due to pollen: Secondary | ICD-10-CM | POA: Diagnosis not present

## 2018-05-23 DIAGNOSIS — J3081 Allergic rhinitis due to animal (cat) (dog) hair and dander: Secondary | ICD-10-CM | POA: Diagnosis not present

## 2018-05-23 DIAGNOSIS — J3089 Other allergic rhinitis: Secondary | ICD-10-CM | POA: Diagnosis not present

## 2018-05-28 ENCOUNTER — Other Ambulatory Visit: Payer: Self-pay | Admitting: Neurology

## 2018-06-05 DIAGNOSIS — L209 Atopic dermatitis, unspecified: Secondary | ICD-10-CM | POA: Diagnosis not present

## 2018-06-05 DIAGNOSIS — J4540 Moderate persistent asthma, uncomplicated: Secondary | ICD-10-CM | POA: Diagnosis not present

## 2018-06-05 DIAGNOSIS — J3081 Allergic rhinitis due to animal (cat) (dog) hair and dander: Secondary | ICD-10-CM | POA: Diagnosis not present

## 2018-06-05 DIAGNOSIS — J3089 Other allergic rhinitis: Secondary | ICD-10-CM | POA: Diagnosis not present

## 2018-06-26 DIAGNOSIS — J3081 Allergic rhinitis due to animal (cat) (dog) hair and dander: Secondary | ICD-10-CM | POA: Diagnosis not present

## 2018-06-26 DIAGNOSIS — J3089 Other allergic rhinitis: Secondary | ICD-10-CM | POA: Diagnosis not present

## 2018-06-26 DIAGNOSIS — J301 Allergic rhinitis due to pollen: Secondary | ICD-10-CM | POA: Diagnosis not present

## 2018-07-04 DIAGNOSIS — J3081 Allergic rhinitis due to animal (cat) (dog) hair and dander: Secondary | ICD-10-CM | POA: Diagnosis not present

## 2018-07-04 DIAGNOSIS — J3089 Other allergic rhinitis: Secondary | ICD-10-CM | POA: Diagnosis not present

## 2018-07-04 DIAGNOSIS — J301 Allergic rhinitis due to pollen: Secondary | ICD-10-CM | POA: Diagnosis not present

## 2018-07-25 DIAGNOSIS — J3089 Other allergic rhinitis: Secondary | ICD-10-CM | POA: Diagnosis not present

## 2018-07-25 DIAGNOSIS — J3081 Allergic rhinitis due to animal (cat) (dog) hair and dander: Secondary | ICD-10-CM | POA: Diagnosis not present

## 2018-07-25 DIAGNOSIS — J301 Allergic rhinitis due to pollen: Secondary | ICD-10-CM | POA: Diagnosis not present

## 2018-08-01 DIAGNOSIS — J3089 Other allergic rhinitis: Secondary | ICD-10-CM | POA: Diagnosis not present

## 2018-08-01 DIAGNOSIS — J301 Allergic rhinitis due to pollen: Secondary | ICD-10-CM | POA: Diagnosis not present

## 2018-08-01 DIAGNOSIS — J3081 Allergic rhinitis due to animal (cat) (dog) hair and dander: Secondary | ICD-10-CM | POA: Diagnosis not present

## 2018-08-02 DIAGNOSIS — J4540 Moderate persistent asthma, uncomplicated: Secondary | ICD-10-CM | POA: Diagnosis not present

## 2018-08-02 DIAGNOSIS — E039 Hypothyroidism, unspecified: Secondary | ICD-10-CM | POA: Diagnosis not present

## 2018-08-02 DIAGNOSIS — M25649 Stiffness of unspecified hand, not elsewhere classified: Secondary | ICD-10-CM | POA: Diagnosis not present

## 2018-08-08 DIAGNOSIS — J3089 Other allergic rhinitis: Secondary | ICD-10-CM | POA: Diagnosis not present

## 2018-08-08 DIAGNOSIS — J3081 Allergic rhinitis due to animal (cat) (dog) hair and dander: Secondary | ICD-10-CM | POA: Diagnosis not present

## 2018-08-08 DIAGNOSIS — J301 Allergic rhinitis due to pollen: Secondary | ICD-10-CM | POA: Diagnosis not present

## 2018-08-09 DIAGNOSIS — J301 Allergic rhinitis due to pollen: Secondary | ICD-10-CM | POA: Diagnosis not present

## 2018-08-09 DIAGNOSIS — J3081 Allergic rhinitis due to animal (cat) (dog) hair and dander: Secondary | ICD-10-CM | POA: Diagnosis not present

## 2018-08-10 DIAGNOSIS — J3089 Other allergic rhinitis: Secondary | ICD-10-CM | POA: Diagnosis not present

## 2018-08-30 DIAGNOSIS — T22211A Burn of second degree of right forearm, initial encounter: Secondary | ICD-10-CM | POA: Diagnosis not present

## 2018-08-30 DIAGNOSIS — Z86018 Personal history of other benign neoplasm: Secondary | ICD-10-CM | POA: Diagnosis not present

## 2018-08-30 DIAGNOSIS — L723 Sebaceous cyst: Secondary | ICD-10-CM | POA: Diagnosis not present

## 2018-08-30 DIAGNOSIS — D2230 Melanocytic nevi of unspecified part of face: Secondary | ICD-10-CM | POA: Diagnosis not present

## 2018-09-04 DIAGNOSIS — J301 Allergic rhinitis due to pollen: Secondary | ICD-10-CM | POA: Diagnosis not present

## 2018-09-04 DIAGNOSIS — J3081 Allergic rhinitis due to animal (cat) (dog) hair and dander: Secondary | ICD-10-CM | POA: Diagnosis not present

## 2018-09-04 DIAGNOSIS — J3089 Other allergic rhinitis: Secondary | ICD-10-CM | POA: Diagnosis not present

## 2018-09-13 NOTE — Progress Notes (Addendum)
Virtual Visit via Video Note The purpose of this virtual visit is to provide medical care while limiting exposure to the novel coronavirus.    Consent was obtained for video visit:  Yes.   Answered questions that patient had about telehealth interaction:  Yes.   I discussed the limitations, risks, security and privacy concerns of performing an evaluation and management service by telemedicine. I also discussed with the patient that there may be a patient responsible charge related to this service. The patient expressed understanding and agreed to proceed.  Pt location: Home Physician Location: Home Name of referring provider:  Lanice Shirts, * I connected with Katie Oneill at patients initiation/request on 09/14/2018 at  8:30 AM EDT by video enabled telemedicine application and verified that I am speaking with the correct person using two identifiers. Pt MRN:  [id number] Pt DOB:  01-17-1961 Video Participants:  Katie Oneill;   History of Present Illness:  Katie Oneill is a 58 year old right-handed woman with history of hypothyroidism and asthma who follows up for vestibular migraine, stabbing headache and thigh pain.  UPDATE: She is taking gabapentin 600mg  at bedtime.    Vestibular migraine:  No spells. Primary stabbing headache:  Severe, seconds to couple of minutes, maybe once a month followed by mild dull headache next day lasting a couple of hours. Neuralgia:  Controlled.  Once in awhile occasional pain in forearm, thigh or foot.  Depression/anxiety:  No Sleep hygiene: Variable  HISTORY: In November 2014, she started experiencing episodes of headache associated with slowed processing, clumsiness of her right hand, difficulty getting words out and unsteady gait, possibly veering towards the right. The headaches were described as bi-temporal aching or stabbing pain, anywhere from 3 to 7/10. For a period of 6 weeks, she was getting them almost daily. She reportedly had  an MRI of the brain performed 02/13/13, which was unremarkable. She had an MRI of the brain without contrast performed on 02/28/13, which was normal. Carotid duplex performed 03/28/13 revealed no hemodynamically significant stenosis. MRA of the head was performed on 03/29/13, which revealed focal area of decreased signal in the left ICA, thought to be artifact. Blood work was performed on 03/16/13, which showed ANA negative, Sed Rate 3, and ACE 47. Symptoms had resolved and migraine was suspected. She was advised to start ASA 81mg  daily.  Starting in September 2015, she began experiencing similar episodes. At that time, she woke up one morning with headache, spinning sensation and nausea. She fell back to sleep and when she woke up 2 hours later, it had resolved. She later developed another episode of dizziness, associated with headache. She also reported slight clumsiness of the right hand. During an episode, it is more difficult to write. It lasted 5 days.   She was evaluated for dizziness by Dr. Constance Holster, ENT. She was experiencing vertigo and imbalance with aural fullness and possible hearing loss. Exam was normal.    Personal history of headache: No but when she was 14 or 15, she was experiencing similar episodes of dizziness. Family history of headache: no  Past Medical History: Past Medical History:  Diagnosis Date  . Asthma   . GERD (gastroesophageal reflux disease)   . Gout   . Headache(784.0) 03/15/2013  . Hypothyroid   . IBS (irritable bowel syndrome)     Medications: Outpatient Encounter Medications as of 09/14/2018  Medication Sig Note  . acetaminophen (TYLENOL) 325 MG tablet Take 2 tablets (650 mg total) by mouth every 6 (  six) hours as needed for mild pain or moderate pain.   Marland Kitchen BREO ELLIPTA 200-25 MCG/INH AEPB Take 1 puff by mouth daily. 11/06/2014: Received from: External Pharmacy Received Sig:   . EPIPEN 2-PAK 0.3 MG/0.3ML SOAJ injection See admin instructions.  11/06/2014: Received from: External Pharmacy  . escitalopram (LEXAPRO) 10 MG tablet Take 10 mg by mouth daily.   Marland Kitchen gabapentin (NEURONTIN) 300 MG capsule TAKE 2 CAPSULES (600 MG TOTAL) BY MOUTH AT BEDTIME.   Marland Kitchen levothyroxine (SYNTHROID, LEVOTHROID) 75 MCG tablet Take 75 mcg by mouth every other day. 12/19/2015: Monday - Friday  . levothyroxine (SYNTHROID, LEVOTHROID) 88 MCG tablet Take 88 mcg by mouth daily before breakfast. 12/19/2015: Saturday and Sunday   . LORazepam (ATIVAN) 0.5 MG tablet Take 1 tablet (0.5 mg total) by mouth 2 (two) times daily as needed for anxiety (usually only uses for flight).   . meclizine (ANTIVERT) 25 MG tablet Take 1 tablet (25 mg total) by mouth 3 (three) times daily as needed for dizziness.   . ondansetron (ZOFRAN) 4 MG tablet Take 4 mg by mouth every 8 (eight) hours as needed for nausea or vomiting (Take 1 tablet by mouth every eight hours as needed for nausea).   . pantoprazole (PROTONIX) 40 MG tablet Take 40 mg by mouth daily.   Marland Kitchen PREMARIN vaginal cream  09/08/2015: Received from: External Pharmacy  . Probiotic Product (PROBIOTIC ADVANCED PO) Take by mouth daily.   . ranitidine (ZANTAC) 150 MG tablet Take 150 mg by mouth 2 (two) times daily.   Penne Lash HFA 45 MCG/ACT inhaler Inhale 2 puffs into the lungs as needed (SOB).  03/15/2013: Received from: External Pharmacy Received Sig:    No facility-administered encounter medications on file as of 09/14/2018.     Allergies: Allergies  Allergen Reactions  . Shellfish Allergy Nausea And Vomiting  . Penicillins Hives  . Sulfa Antibiotics Hives    Family History: Family History  Problem Relation Age of Onset  . Heart disease Mother   . Heart disease Father   . Melanoma Father   . Heart disease Maternal Grandmother   . Cancer Maternal Grandfather        brain cancer  . Stroke Paternal Grandfather   . Cancer Paternal Grandmother        Breast cancer     Social History: Social History   Socioeconomic  History  . Marital status: Married    Spouse name: Not on file  . Number of children: 3  . Years of education: post grad  . Highest education level: Not on file  Occupational History  . Occupation: Passenger transport manager: [employer]  Social Needs  . Financial resource strain: Not on file  . Food insecurity    Worry: Not on file    Inability: Not on file  . Transportation needs    Medical: Not on file    Non-medical: Not on file  Tobacco Use  . Smoking status: Never Smoker  . Smokeless tobacco: Never Used  Substance and Sexual Activity  . Alcohol use: Yes    Alcohol/week: 1.0 standard drinks    Types: 1 Standard drinks or equivalent per week    Comment: rarely  . Drug use: No  . Sexual activity: Never    Partners: Male  Lifestyle  . Physical activity    Days per week: Not on file    Minutes per session: Not on file  . Stress:  Not on file  Relationships  . Social Herbalist on phone: Not on file    Gets together: Not on file    Attends religious service: Not on file    Active member of club or organization: Not on file    Attends meetings of clubs or organizations: Not on file    Relationship status: Not on file  . Intimate partner violence    Fear of current or ex partner: Not on file    Emotionally abused: Not on file    Physically abused: Not on file    Forced sexual activity: Not on file  Other Topics Concern  . Not on file  Social History Narrative  . Not on file    Observations/Objective:   Temperature (!) 97 F (36.1 C), height 5\' 2"  (1.575 m), weight 144 lb (65.3 kg). No acute distress.  Alert and oriented.  Speech fluent and not dysarthric.  Language intact.  Eyes orthophoric on primary gaze.  Face symmetric.  Assessment and Plan:   1.  Migraine with brainstem aura/vestibular migraine 2.  Primary stabbing headache 3.  Neuralgia  1.  Gabapentin 600mg  at bedtime 2.  Tylenol as needed.  Limit use of pain  relievers to no more than 2 days out of week to prevent risk of rebound or medication-overuse headache. 3.  Keep headache diary 4.  Follow up in one year  Follow Up Instructions:    -I discussed the assessment and treatment plan with the patient. The patient was provided an opportunity to ask questions and all were answered. The patient agreed with the plan and demonstrated an understanding of the instructions.   The patient was advised to call back or seek an in-person evaluation if the symptoms worsen or if the condition fails to improve as anticipated.    Dudley Major, DO

## 2018-09-14 ENCOUNTER — Encounter: Payer: Self-pay | Admitting: Neurology

## 2018-09-14 ENCOUNTER — Other Ambulatory Visit: Payer: Self-pay

## 2018-09-14 ENCOUNTER — Telehealth (INDEPENDENT_AMBULATORY_CARE_PROVIDER_SITE_OTHER): Payer: BC Managed Care – PPO | Admitting: Neurology

## 2018-09-14 VITALS — Temp 97.0°F | Ht 62.0 in | Wt 144.0 lb

## 2018-09-14 DIAGNOSIS — G43809 Other migraine, not intractable, without status migrainosus: Secondary | ICD-10-CM

## 2018-09-14 DIAGNOSIS — G4485 Primary stabbing headache: Secondary | ICD-10-CM

## 2018-09-14 DIAGNOSIS — M792 Neuralgia and neuritis, unspecified: Secondary | ICD-10-CM

## 2018-09-14 DIAGNOSIS — G43109 Migraine with aura, not intractable, without status migrainosus: Secondary | ICD-10-CM | POA: Diagnosis not present

## 2018-09-19 DIAGNOSIS — J301 Allergic rhinitis due to pollen: Secondary | ICD-10-CM | POA: Diagnosis not present

## 2018-09-19 DIAGNOSIS — J3081 Allergic rhinitis due to animal (cat) (dog) hair and dander: Secondary | ICD-10-CM | POA: Diagnosis not present

## 2018-09-19 DIAGNOSIS — J3089 Other allergic rhinitis: Secondary | ICD-10-CM | POA: Diagnosis not present

## 2018-09-20 ENCOUNTER — Ambulatory Visit: Payer: 59 | Admitting: Neurology

## 2018-09-29 DIAGNOSIS — J3081 Allergic rhinitis due to animal (cat) (dog) hair and dander: Secondary | ICD-10-CM | POA: Diagnosis not present

## 2018-09-29 DIAGNOSIS — J301 Allergic rhinitis due to pollen: Secondary | ICD-10-CM | POA: Diagnosis not present

## 2018-09-29 DIAGNOSIS — J3089 Other allergic rhinitis: Secondary | ICD-10-CM | POA: Diagnosis not present

## 2018-10-06 ENCOUNTER — Other Ambulatory Visit: Payer: Self-pay | Admitting: Neurology

## 2018-10-06 DIAGNOSIS — Z1231 Encounter for screening mammogram for malignant neoplasm of breast: Secondary | ICD-10-CM | POA: Diagnosis not present

## 2018-10-06 DIAGNOSIS — Z6826 Body mass index (BMI) 26.0-26.9, adult: Secondary | ICD-10-CM | POA: Diagnosis not present

## 2018-10-06 DIAGNOSIS — Z01419 Encounter for gynecological examination (general) (routine) without abnormal findings: Secondary | ICD-10-CM | POA: Diagnosis not present

## 2018-10-12 DIAGNOSIS — J3089 Other allergic rhinitis: Secondary | ICD-10-CM | POA: Diagnosis not present

## 2018-10-12 DIAGNOSIS — J3081 Allergic rhinitis due to animal (cat) (dog) hair and dander: Secondary | ICD-10-CM | POA: Diagnosis not present

## 2018-10-12 DIAGNOSIS — J301 Allergic rhinitis due to pollen: Secondary | ICD-10-CM | POA: Diagnosis not present

## 2018-10-17 DIAGNOSIS — J301 Allergic rhinitis due to pollen: Secondary | ICD-10-CM | POA: Diagnosis not present

## 2018-10-17 DIAGNOSIS — J3081 Allergic rhinitis due to animal (cat) (dog) hair and dander: Secondary | ICD-10-CM | POA: Diagnosis not present

## 2018-10-17 DIAGNOSIS — J3089 Other allergic rhinitis: Secondary | ICD-10-CM | POA: Diagnosis not present

## 2018-10-20 DIAGNOSIS — J301 Allergic rhinitis due to pollen: Secondary | ICD-10-CM | POA: Diagnosis not present

## 2018-10-20 DIAGNOSIS — J3089 Other allergic rhinitis: Secondary | ICD-10-CM | POA: Diagnosis not present

## 2018-10-20 DIAGNOSIS — J3081 Allergic rhinitis due to animal (cat) (dog) hair and dander: Secondary | ICD-10-CM | POA: Diagnosis not present

## 2018-10-23 DIAGNOSIS — J3081 Allergic rhinitis due to animal (cat) (dog) hair and dander: Secondary | ICD-10-CM | POA: Diagnosis not present

## 2018-10-23 DIAGNOSIS — J3089 Other allergic rhinitis: Secondary | ICD-10-CM | POA: Diagnosis not present

## 2018-10-23 DIAGNOSIS — J301 Allergic rhinitis due to pollen: Secondary | ICD-10-CM | POA: Diagnosis not present

## 2018-10-25 DIAGNOSIS — J3081 Allergic rhinitis due to animal (cat) (dog) hair and dander: Secondary | ICD-10-CM | POA: Diagnosis not present

## 2018-10-25 DIAGNOSIS — J3089 Other allergic rhinitis: Secondary | ICD-10-CM | POA: Diagnosis not present

## 2018-10-25 DIAGNOSIS — J301 Allergic rhinitis due to pollen: Secondary | ICD-10-CM | POA: Diagnosis not present

## 2018-11-01 DIAGNOSIS — J3081 Allergic rhinitis due to animal (cat) (dog) hair and dander: Secondary | ICD-10-CM | POA: Diagnosis not present

## 2018-11-01 DIAGNOSIS — J301 Allergic rhinitis due to pollen: Secondary | ICD-10-CM | POA: Diagnosis not present

## 2018-11-01 DIAGNOSIS — J3089 Other allergic rhinitis: Secondary | ICD-10-CM | POA: Diagnosis not present

## 2018-11-09 ENCOUNTER — Other Ambulatory Visit: Payer: Self-pay

## 2018-11-13 ENCOUNTER — Encounter: Payer: Self-pay | Admitting: Internal Medicine

## 2018-11-13 ENCOUNTER — Ambulatory Visit (INDEPENDENT_AMBULATORY_CARE_PROVIDER_SITE_OTHER): Payer: BC Managed Care – PPO | Admitting: Internal Medicine

## 2018-11-13 ENCOUNTER — Other Ambulatory Visit: Payer: Self-pay

## 2018-11-13 VITALS — BP 136/74 | HR 71 | Temp 98.6°F | Ht 62.0 in | Wt 148.0 lb

## 2018-11-13 DIAGNOSIS — J3089 Other allergic rhinitis: Secondary | ICD-10-CM | POA: Diagnosis not present

## 2018-11-13 DIAGNOSIS — E063 Autoimmune thyroiditis: Secondary | ICD-10-CM | POA: Insufficient documentation

## 2018-11-13 DIAGNOSIS — L2089 Other atopic dermatitis: Secondary | ICD-10-CM | POA: Diagnosis not present

## 2018-11-13 DIAGNOSIS — J4540 Moderate persistent asthma, uncomplicated: Secondary | ICD-10-CM | POA: Diagnosis not present

## 2018-11-13 DIAGNOSIS — E041 Nontoxic single thyroid nodule: Secondary | ICD-10-CM | POA: Diagnosis not present

## 2018-11-13 DIAGNOSIS — J301 Allergic rhinitis due to pollen: Secondary | ICD-10-CM | POA: Diagnosis not present

## 2018-11-13 DIAGNOSIS — J3081 Allergic rhinitis due to animal (cat) (dog) hair and dander: Secondary | ICD-10-CM | POA: Diagnosis not present

## 2018-11-13 NOTE — Patient Instructions (Signed)
You are on levothyroxine - which is your thyroid hormone supplement. You MUST take this consistently.  You should take this first thing in the morning on an empty stomach with water. You should not take it with other medications. Wait 71min to 1hr prior to eating. If you are taking any vitamins - please take these in the evening.   If you miss a dose, please take your missed dose the following day (double the dose for that day). You should have a pill box for ONLY levothyroxine on your bedside table to help you remember to take your medications.    - We will set you up for thyroid ultrasound , and will contact you with the results

## 2018-11-13 NOTE — Progress Notes (Signed)
Name: Katie Oneill  MRN/ DOB: [id number], 10-10-1960    Age/ Sex: 58 y.o., female    PCP: Leeroy Cha, MD   Reason for Endocrinology Evaluation: Hypothyroidism     Date of Initial Endocrinology Evaluation: 11/13/2018     HPI: Katie Oneill is a 58 y.o. female with a past medical history of hypothyroidism and allergic rhinitis. The patient presented for initial endocrinology clinic visit on 11/13/2018 for consultative assistance with her Hypothyroidism.   She has been diagnosed with hashimoto's thyroiditis  Many years ago and has been on LT-4 replacement for years.    An ultrasound was performed in 2018 which demonstrated a right inferior 1.4 cm nodule with benign cytology. Repeat imaging in 2019 confirmed stability.      Today she denies any local neck symptoms   She is not on biotin No prior radiation exposure She notes chronic fatigue but she also has interrupted sleep, she has chronic constipation as well and takes laxatives, denies depression but has anxiety which is controlled on Lexapro.   Fluctuates energy level, has chronic fatigue , has interrupted sleep   Niece was diagnosed with  thyroid cancer and pt requested a referral to an endocrinologist.   Runs a [employer]   HISTORY:  Past Medical History:  Past Medical History:  Diagnosis Date  . Asthma   . GERD (gastroesophageal reflux disease)   . Gout   . Headache(784.0) 03/15/2013  . Hypothyroid   . IBS (irritable bowel syndrome)    Past Surgical History:  Past Surgical History:  Procedure Laterality Date  . ABDOMINAL HYSTERECTOMY    . BUNIONECTOMY Left   . CHOLECYSTECTOMY N/A 06/20/2013   Procedure: LAPAROSCOPIC CHOLECYSTECTOMY WITH INTRAOPERATIVE CHOLANGIOGRAM;  Surgeon: Earnstine Regal, MD;  Location: [facility];  Service: General;  Laterality: N/A;  . ERCP N/A 06/19/2013   Procedure: ENDOSCOPIC RETROGRADE CHOLANGIOPANCREATOGRAPHY (ERCP);  Surgeon: Beryle Beams, MD;  Location: [facility];   Service: Endoscopy;  Laterality: N/A;  . NASAL SINUS SURGERY     x2  . STRABISMUS SURGERY    . TENDON REPAIR Right    hand  . TONSILLECTOMY  1967      Social History:  reports that she has never smoked. She has never used smokeless tobacco. She reports current alcohol use of about 1.0 standard drinks of alcohol per week. She reports that she does not use drugs.  Family History: family history includes Cancer in her maternal grandfather and paternal grandmother; Heart disease in her father, maternal grandmother, and mother; Melanoma in her father; Stroke in her paternal grandfather.   HOME MEDICATIONS: Allergies as of 11/13/2018      Reactions   Shellfish Allergy Nausea And Vomiting   Penicillins Hives   Sulfa Antibiotics Hives      Medication List       Accurate as of November 13, 2018  4:10 PM. If you have any questions, ask your nurse or doctor.        acetaminophen 325 MG tablet Commonly known as: TYLENOL Take 2 tablets (650 mg total) by mouth every 6 (six) hours as needed for mild pain or moderate pain.   Advair HFA 230-21 MCG/ACT inhaler Generic drug: fluticasone-salmeterol   B-100 Tabs Take by mouth.   Breo Ellipta 200-25 MCG/INH Aepb Generic drug: fluticasone furoate-vilanterol Take 1 puff by mouth daily.   Calcium 200 MG Tabs Take by mouth.   Culturelle Caps Take by mouth.   EpiPen 2-Pak 0.3 mg/0.3 mL  Soaj injection Generic drug: EPINEPHrine See admin instructions.   escitalopram 10 MG tablet Commonly known as: LEXAPRO Take 10 mg by mouth daily.   fexofenadine-pseudoephedrine 180-240 MG 24 hr tablet Commonly known as: ALLEGRA-D 24 Take 1 tablet by mouth daily.   FISH OIL ADULT GUMMIES PO Take by mouth.   FLONASE SENSIMIST NA Place into the nose.   gabapentin 300 MG capsule Commonly known as: NEURONTIN TAKE 2 CAPSULES (600 MG TOTAL) BY MOUTH AT BEDTIME.   levothyroxine 88 MCG tablet Commonly known as: SYNTHROID Take 88 mcg by mouth  daily before breakfast.   levothyroxine 75 MCG tablet Commonly known as: SYNTHROID Take 75 mcg by mouth every other day.   LORazepam 0.5 MG tablet Commonly known as: ATIVAN Take 1 tablet (0.5 mg total) by mouth 2 (two) times daily as needed for anxiety (usually only uses for flight).   meclizine 25 MG tablet Commonly known as: ANTIVERT Take 1 tablet (25 mg total) by mouth 3 (three) times daily as needed for dizziness.   PROBIOTIC ADVANCED PO Take by mouth daily.   QC TUMERIC COMPLEX PO Take by mouth.   vitamin B-12 250 MCG tablet Commonly known as: CYANOCOBALAMIN Take 250 mcg by mouth daily.   Vitamin D3 50 MCG (2000 UT) Tabs Take by mouth.   Xopenex HFA 45 MCG/ACT inhaler Generic drug: levalbuterol Inhale 2 puffs into the lungs as needed (SOB).         REVIEW OF SYSTEMS: A comprehensive ROS was conducted with the patient and is negative except as per HPI and below:  Review of Systems  Constitutional: Positive for malaise/fatigue. Negative for fever.  HENT: Negative for congestion and sore throat.   Respiratory: Negative for cough and shortness of breath.   Cardiovascular: Negative for chest pain and palpitations.  Gastrointestinal: Positive for constipation. Negative for diarrhea.  Psychiatric/Behavioral: Negative for depression. The patient is nervous/anxious.        OBJECTIVE:  VS: BP 136/74 (BP Location: Left Arm, Patient Position: Sitting, Cuff Size: Normal)   Pulse 71   Temp 98.6 F (37 C)   Ht 5\' 2"  (1.575 m)   Wt 148 lb (67.1 kg)   SpO2 98%   BMI 27.07 kg/m    Wt Readings from Last 3 Encounters:  11/13/18 148 lb (67.1 kg)  09/14/18 144 lb (65.3 kg)  09/19/17 147 lb (66.7 kg)     EXAM: General: Pt appears well and is in NAD  Hydration: Well-hydrated with moist mucous membranes and good skin turgor  Eyes: External eye exam normal without stare, lid lag or exophthalmos.  EOM intact.   Ears, Nose, Throat: Hearing: Grossly intact bilaterally  Dental: Good dentition  Throat: Clear without mass, erythema or exudate  Neck: General: Supple without adenopathy. Thyroid: Thyroid size normal.  No goiter or nodules appreciated. No thyroid bruit.  Lungs: Clear with good BS bilat with no rales, rhonchi, or wheezes  Heart: Auscultation: RRR.  Abdomen: Normoactive bowel sounds, soft, nontender, without masses or organomegaly palpable  Extremities:  BL LE: No pretibial edema normal ROM and strength.  Skin: Hair: Texture and amount normal with gender appropriate distribution Skin Inspection: No rashes Skin Palpation: Skin temperature, texture, and thickness normal to palpation  Neuro: Cranial nerves: II - XII grossly intact  Motor: Normal strength throughout DTRs: 2+ and symmetric in UE without delay in relaxation phase  Mental Status: Judgment, insight: Intact Orientation: Oriented to time, place, and person Mood and affect: No depression, anxiety, or agitation  DATA REVIEWED:   11/25/2017  TSH 4.45 uIU/mL    08/02/2018  TSH 1.41 uIU/mL  FT4 0.95 ng/dL     Thyroid Ultrasound 12/05/2017  The previously biopsied approximately 1.4 x 0.7 x 0.7 cm nodule within the mid/inferior aspect the right lobe of the thyroid (labeled 1) is unchanged compared to the 11/2016 examination, previously, 1.4 x 0.8 x 0.7 cm. Correlation with prior biopsy results is recommended.  IMPRESSION: 1. No new or enlarging thyroid nodules. 2. Previously biopsied approximately 1.4 cm nodule within the mid/inferior aspect of the right lobe of the thyroid is unchanged compared to the 11/2016 examination   FNA 12/01/2016 THYROID, FINE NEEDLE ASPIRATION, RLP (SPECIMEN 1 OF 1, COLLECTED 12/01/16): CONSISTENT WITH LYMPHOCYTIC (HASHIMOTO) THYROIDITIS IN THE PROPER CLINICAL CONTEXT (BETHESDA CATEGORY II).  ASSESSMENT/PLAN/RECOMMENDATIONS:   1. Hypothyroidism Secondary to Hashimoto's Thyroiditis :   - Clinically and biochemically euthyroid  - Pt  educated extensively on the correct way to take levothyroxine (first thing in the morning with water, 30 minutes before eating or taking other medications). - Pt encouraged to double dose the following day if she were to miss a dose given long half-life of levothyroxine.   Medications : Alternated levothyroxine 88 mcg with 75 mcg daily    2. Right thyroid Nodule :   - No local neck symptoms  - S/P Benign FNA in 2018 - Repeat ultrasound in 2019 was stable  - Discussed repeating a thyroid ultrasound annually for a total of 5 yrs to establish stability.     Pt expressed understanding   F/U in 1 yr   Signed electronically by: Mack Guise, MD  [facility]  [address] [place], [place] [address] Phone: [phone] FAX: [phone]   CC: Leeroy Cha, MD [address] [place] [address] Phone: [phone] Fax: [phone]   Return to Endocrinology clinic as below: Future Appointments  Date Time Provider [place]  09/14/2019  8:50 AM Pieter Partridge, DO [facility] None  11/13/2019  3:00 PM , Melanie Crazier, MD [facility] None

## 2018-11-17 DIAGNOSIS — J3489 Other specified disorders of nose and nasal sinuses: Secondary | ICD-10-CM | POA: Insufficient documentation

## 2018-11-17 DIAGNOSIS — R0683 Snoring: Secondary | ICD-10-CM | POA: Diagnosis not present

## 2018-11-22 DIAGNOSIS — J301 Allergic rhinitis due to pollen: Secondary | ICD-10-CM | POA: Diagnosis not present

## 2018-11-22 DIAGNOSIS — J3081 Allergic rhinitis due to animal (cat) (dog) hair and dander: Secondary | ICD-10-CM | POA: Diagnosis not present

## 2018-11-22 DIAGNOSIS — J3089 Other allergic rhinitis: Secondary | ICD-10-CM | POA: Diagnosis not present

## 2018-11-27 DIAGNOSIS — Z23 Encounter for immunization: Secondary | ICD-10-CM | POA: Diagnosis not present

## 2018-11-27 DIAGNOSIS — E039 Hypothyroidism, unspecified: Secondary | ICD-10-CM | POA: Diagnosis not present

## 2018-11-27 DIAGNOSIS — Z8249 Family history of ischemic heart disease and other diseases of the circulatory system: Secondary | ICD-10-CM | POA: Diagnosis not present

## 2018-11-27 DIAGNOSIS — Z0000 Encounter for general adult medical examination without abnormal findings: Secondary | ICD-10-CM | POA: Diagnosis not present

## 2018-11-27 DIAGNOSIS — B001 Herpesviral vesicular dermatitis: Secondary | ICD-10-CM | POA: Diagnosis not present

## 2018-11-29 DIAGNOSIS — J301 Allergic rhinitis due to pollen: Secondary | ICD-10-CM | POA: Diagnosis not present

## 2018-11-29 DIAGNOSIS — J3081 Allergic rhinitis due to animal (cat) (dog) hair and dander: Secondary | ICD-10-CM | POA: Diagnosis not present

## 2018-11-29 DIAGNOSIS — J3089 Other allergic rhinitis: Secondary | ICD-10-CM | POA: Diagnosis not present

## 2018-12-01 ENCOUNTER — Ambulatory Visit
Admission: RE | Admit: 2018-12-01 | Discharge: 2018-12-01 | Disposition: A | Discharge status: Home / Self Care | Payer: BC Managed Care – PPO | Source: Ambulatory Visit | Attending: Internal Medicine | Admitting: Internal Medicine

## 2018-12-01 DIAGNOSIS — E041 Nontoxic single thyroid nodule: Secondary | ICD-10-CM

## 2018-12-04 ENCOUNTER — Other Ambulatory Visit: Payer: Self-pay

## 2018-12-04 DIAGNOSIS — Z20822 Contact with and (suspected) exposure to covid-19: Secondary | ICD-10-CM

## 2018-12-04 DIAGNOSIS — Z20828 Contact with and (suspected) exposure to other viral communicable diseases: Secondary | ICD-10-CM | POA: Diagnosis not present

## 2018-12-05 LAB — NOVEL CORONAVIRUS, NAA: SARS-CoV-2, NAA: NOT DETECTED

## 2018-12-06 DIAGNOSIS — R05 Cough: Secondary | ICD-10-CM | POA: Diagnosis not present

## 2018-12-06 DIAGNOSIS — L2089 Other atopic dermatitis: Secondary | ICD-10-CM | POA: Diagnosis not present

## 2018-12-06 DIAGNOSIS — J4540 Moderate persistent asthma, uncomplicated: Secondary | ICD-10-CM | POA: Diagnosis not present

## 2018-12-06 DIAGNOSIS — J3089 Other allergic rhinitis: Secondary | ICD-10-CM | POA: Diagnosis not present

## 2018-12-13 DIAGNOSIS — J301 Allergic rhinitis due to pollen: Secondary | ICD-10-CM | POA: Diagnosis not present

## 2018-12-13 DIAGNOSIS — J3089 Other allergic rhinitis: Secondary | ICD-10-CM | POA: Diagnosis not present

## 2018-12-13 DIAGNOSIS — J3081 Allergic rhinitis due to animal (cat) (dog) hair and dander: Secondary | ICD-10-CM | POA: Diagnosis not present

## 2018-12-21 DIAGNOSIS — J301 Allergic rhinitis due to pollen: Secondary | ICD-10-CM | POA: Diagnosis not present

## 2018-12-21 DIAGNOSIS — J3089 Other allergic rhinitis: Secondary | ICD-10-CM | POA: Diagnosis not present

## 2018-12-21 DIAGNOSIS — J3081 Allergic rhinitis due to animal (cat) (dog) hair and dander: Secondary | ICD-10-CM | POA: Diagnosis not present

## 2018-12-25 DIAGNOSIS — M84374D Stress fracture, right foot, subsequent encounter for fracture with routine healing: Secondary | ICD-10-CM | POA: Diagnosis not present

## 2018-12-27 DIAGNOSIS — J301 Allergic rhinitis due to pollen: Secondary | ICD-10-CM | POA: Diagnosis not present

## 2018-12-27 DIAGNOSIS — J3081 Allergic rhinitis due to animal (cat) (dog) hair and dander: Secondary | ICD-10-CM | POA: Diagnosis not present

## 2018-12-27 DIAGNOSIS — J3089 Other allergic rhinitis: Secondary | ICD-10-CM | POA: Diagnosis not present

## 2019-01-05 DIAGNOSIS — J3089 Other allergic rhinitis: Secondary | ICD-10-CM | POA: Diagnosis not present

## 2019-01-05 DIAGNOSIS — J301 Allergic rhinitis due to pollen: Secondary | ICD-10-CM | POA: Diagnosis not present

## 2019-01-05 DIAGNOSIS — J3081 Allergic rhinitis due to animal (cat) (dog) hair and dander: Secondary | ICD-10-CM | POA: Diagnosis not present

## 2019-01-15 DIAGNOSIS — J301 Allergic rhinitis due to pollen: Secondary | ICD-10-CM | POA: Diagnosis not present

## 2019-01-15 DIAGNOSIS — J3089 Other allergic rhinitis: Secondary | ICD-10-CM | POA: Diagnosis not present

## 2019-01-15 DIAGNOSIS — J3081 Allergic rhinitis due to animal (cat) (dog) hair and dander: Secondary | ICD-10-CM | POA: Diagnosis not present

## 2019-01-29 DIAGNOSIS — J301 Allergic rhinitis due to pollen: Secondary | ICD-10-CM | POA: Diagnosis not present

## 2019-01-29 DIAGNOSIS — J3089 Other allergic rhinitis: Secondary | ICD-10-CM | POA: Diagnosis not present

## 2019-01-29 DIAGNOSIS — J3081 Allergic rhinitis due to animal (cat) (dog) hair and dander: Secondary | ICD-10-CM | POA: Diagnosis not present

## 2019-02-09 DIAGNOSIS — J3089 Other allergic rhinitis: Secondary | ICD-10-CM | POA: Diagnosis not present

## 2019-02-09 DIAGNOSIS — J301 Allergic rhinitis due to pollen: Secondary | ICD-10-CM | POA: Diagnosis not present

## 2019-02-09 DIAGNOSIS — J3081 Allergic rhinitis due to animal (cat) (dog) hair and dander: Secondary | ICD-10-CM | POA: Diagnosis not present

## 2019-02-21 DIAGNOSIS — J3081 Allergic rhinitis due to animal (cat) (dog) hair and dander: Secondary | ICD-10-CM | POA: Diagnosis not present

## 2019-02-21 DIAGNOSIS — J3089 Other allergic rhinitis: Secondary | ICD-10-CM | POA: Diagnosis not present

## 2019-02-21 DIAGNOSIS — J301 Allergic rhinitis due to pollen: Secondary | ICD-10-CM | POA: Diagnosis not present

## 2019-03-01 DIAGNOSIS — J3081 Allergic rhinitis due to animal (cat) (dog) hair and dander: Secondary | ICD-10-CM | POA: Diagnosis not present

## 2019-03-01 DIAGNOSIS — Z87312 Personal history of (healed) stress fracture: Secondary | ICD-10-CM | POA: Diagnosis not present

## 2019-03-01 DIAGNOSIS — E041 Nontoxic single thyroid nodule: Secondary | ICD-10-CM | POA: Diagnosis not present

## 2019-03-01 DIAGNOSIS — J301 Allergic rhinitis due to pollen: Secondary | ICD-10-CM | POA: Diagnosis not present

## 2019-03-01 DIAGNOSIS — R944 Abnormal results of kidney function studies: Secondary | ICD-10-CM | POA: Diagnosis not present

## 2019-03-01 DIAGNOSIS — J3089 Other allergic rhinitis: Secondary | ICD-10-CM | POA: Diagnosis not present

## 2019-03-01 DIAGNOSIS — M8580 Other specified disorders of bone density and structure, unspecified site: Secondary | ICD-10-CM | POA: Diagnosis not present

## 2019-03-01 DIAGNOSIS — E538 Deficiency of other specified B group vitamins: Secondary | ICD-10-CM | POA: Diagnosis not present

## 2019-03-01 DIAGNOSIS — E559 Vitamin D deficiency, unspecified: Secondary | ICD-10-CM | POA: Diagnosis not present

## 2019-03-01 DIAGNOSIS — E063 Autoimmune thyroiditis: Secondary | ICD-10-CM | POA: Diagnosis not present

## 2019-03-06 DIAGNOSIS — J3081 Allergic rhinitis due to animal (cat) (dog) hair and dander: Secondary | ICD-10-CM | POA: Diagnosis not present

## 2019-03-06 DIAGNOSIS — J301 Allergic rhinitis due to pollen: Secondary | ICD-10-CM | POA: Diagnosis not present

## 2019-03-07 DIAGNOSIS — J3089 Other allergic rhinitis: Secondary | ICD-10-CM | POA: Diagnosis not present

## 2019-03-13 DIAGNOSIS — J3089 Other allergic rhinitis: Secondary | ICD-10-CM | POA: Diagnosis not present

## 2019-03-13 DIAGNOSIS — J301 Allergic rhinitis due to pollen: Secondary | ICD-10-CM | POA: Diagnosis not present

## 2019-03-13 DIAGNOSIS — J3081 Allergic rhinitis due to animal (cat) (dog) hair and dander: Secondary | ICD-10-CM | POA: Diagnosis not present

## 2019-03-26 DIAGNOSIS — J3089 Other allergic rhinitis: Secondary | ICD-10-CM | POA: Diagnosis not present

## 2019-03-26 DIAGNOSIS — J3081 Allergic rhinitis due to animal (cat) (dog) hair and dander: Secondary | ICD-10-CM | POA: Diagnosis not present

## 2019-03-26 DIAGNOSIS — J301 Allergic rhinitis due to pollen: Secondary | ICD-10-CM | POA: Diagnosis not present

## 2019-04-06 DIAGNOSIS — S0502XA Injury of conjunctiva and corneal abrasion without foreign body, left eye, initial encounter: Secondary | ICD-10-CM | POA: Diagnosis not present

## 2019-04-10 DIAGNOSIS — S0502XD Injury of conjunctiva and corneal abrasion without foreign body, left eye, subsequent encounter: Secondary | ICD-10-CM | POA: Diagnosis not present

## 2019-04-18 DIAGNOSIS — J3081 Allergic rhinitis due to animal (cat) (dog) hair and dander: Secondary | ICD-10-CM | POA: Diagnosis not present

## 2019-04-18 DIAGNOSIS — J301 Allergic rhinitis due to pollen: Secondary | ICD-10-CM | POA: Diagnosis not present

## 2019-04-18 DIAGNOSIS — J3089 Other allergic rhinitis: Secondary | ICD-10-CM | POA: Diagnosis not present

## 2019-05-08 DIAGNOSIS — J301 Allergic rhinitis due to pollen: Secondary | ICD-10-CM | POA: Diagnosis not present

## 2019-05-08 DIAGNOSIS — J3081 Allergic rhinitis due to animal (cat) (dog) hair and dander: Secondary | ICD-10-CM | POA: Diagnosis not present

## 2019-05-08 DIAGNOSIS — J3089 Other allergic rhinitis: Secondary | ICD-10-CM | POA: Diagnosis not present

## 2019-05-15 DIAGNOSIS — J301 Allergic rhinitis due to pollen: Secondary | ICD-10-CM | POA: Diagnosis not present

## 2019-05-15 DIAGNOSIS — J3089 Other allergic rhinitis: Secondary | ICD-10-CM | POA: Diagnosis not present

## 2019-05-15 DIAGNOSIS — J3081 Allergic rhinitis due to animal (cat) (dog) hair and dander: Secondary | ICD-10-CM | POA: Diagnosis not present

## 2019-05-18 DIAGNOSIS — J301 Allergic rhinitis due to pollen: Secondary | ICD-10-CM | POA: Diagnosis not present

## 2019-05-18 DIAGNOSIS — J3081 Allergic rhinitis due to animal (cat) (dog) hair and dander: Secondary | ICD-10-CM | POA: Diagnosis not present

## 2019-05-18 DIAGNOSIS — J3089 Other allergic rhinitis: Secondary | ICD-10-CM | POA: Diagnosis not present

## 2019-05-19 ENCOUNTER — Other Ambulatory Visit: Payer: Self-pay | Admitting: Neurology

## 2019-05-23 DIAGNOSIS — J301 Allergic rhinitis due to pollen: Secondary | ICD-10-CM | POA: Diagnosis not present

## 2019-05-23 DIAGNOSIS — J3089 Other allergic rhinitis: Secondary | ICD-10-CM | POA: Diagnosis not present

## 2019-05-23 DIAGNOSIS — J3081 Allergic rhinitis due to animal (cat) (dog) hair and dander: Secondary | ICD-10-CM | POA: Diagnosis not present

## 2019-05-26 DIAGNOSIS — Z20828 Contact with and (suspected) exposure to other viral communicable diseases: Secondary | ICD-10-CM | POA: Diagnosis not present

## 2019-05-28 DIAGNOSIS — J3089 Other allergic rhinitis: Secondary | ICD-10-CM | POA: Diagnosis not present

## 2019-05-28 DIAGNOSIS — J301 Allergic rhinitis due to pollen: Secondary | ICD-10-CM | POA: Diagnosis not present

## 2019-05-28 DIAGNOSIS — L2089 Other atopic dermatitis: Secondary | ICD-10-CM | POA: Diagnosis not present

## 2019-05-28 DIAGNOSIS — J4540 Moderate persistent asthma, uncomplicated: Secondary | ICD-10-CM | POA: Diagnosis not present

## 2019-05-28 DIAGNOSIS — R05 Cough: Secondary | ICD-10-CM | POA: Diagnosis not present

## 2019-06-04 DIAGNOSIS — M8589 Other specified disorders of bone density and structure, multiple sites: Secondary | ICD-10-CM | POA: Diagnosis not present

## 2019-06-15 DIAGNOSIS — M8589 Other specified disorders of bone density and structure, multiple sites: Secondary | ICD-10-CM | POA: Diagnosis not present

## 2019-06-15 DIAGNOSIS — F40243 Fear of flying: Secondary | ICD-10-CM | POA: Diagnosis not present

## 2019-06-18 DIAGNOSIS — J301 Allergic rhinitis due to pollen: Secondary | ICD-10-CM | POA: Diagnosis not present

## 2019-06-18 DIAGNOSIS — J3081 Allergic rhinitis due to animal (cat) (dog) hair and dander: Secondary | ICD-10-CM | POA: Diagnosis not present

## 2019-06-18 DIAGNOSIS — J3089 Other allergic rhinitis: Secondary | ICD-10-CM | POA: Diagnosis not present

## 2019-07-10 DIAGNOSIS — J3089 Other allergic rhinitis: Secondary | ICD-10-CM | POA: Diagnosis not present

## 2019-07-10 DIAGNOSIS — J3081 Allergic rhinitis due to animal (cat) (dog) hair and dander: Secondary | ICD-10-CM | POA: Diagnosis not present

## 2019-07-10 DIAGNOSIS — J301 Allergic rhinitis due to pollen: Secondary | ICD-10-CM | POA: Diagnosis not present

## 2019-07-16 DIAGNOSIS — J3089 Other allergic rhinitis: Secondary | ICD-10-CM | POA: Diagnosis not present

## 2019-07-16 DIAGNOSIS — J3081 Allergic rhinitis due to animal (cat) (dog) hair and dander: Secondary | ICD-10-CM | POA: Diagnosis not present

## 2019-07-16 DIAGNOSIS — J301 Allergic rhinitis due to pollen: Secondary | ICD-10-CM | POA: Diagnosis not present

## 2019-07-27 NOTE — Progress Notes (Signed)
NEUROLOGY FOLLOW UP OFFICE NOTE  Katie Oneill [id number]  HISTORY OF PRESENT ILLNESS: Katie Oneill is a 59 year old right-handed woman with history of hypothyroidism and asthma who follows up for vestibular migraine, stabbing headache and thigh pain.  UPDATE: She is taking gabapentin 600mg  at bedtime.  She started experience recurrence of symptoms about 3 weeks ago.  She feels off-balance.  She notes clumsiness in her hands, frequently dropping objects or difficulty grasping objects.  When she wakes up in the morning, she notes numbness in her hands.  For the past few days, she has had a mild headache and a little dizziness which has not yet progressed.    Depression/anxiety: No Sleep hygiene:Variable  HISTORY: In November 2014, she started experiencing episodes of headache associated with slowed processing, clumsiness of her right hand, difficulty getting words out and unsteady gait, possibly veering towards the right.The headaches were described as bi-temporal aching or stabbing pain, anywhere from 3 to 7/10.For a period of 6 weeks, she was getting them almost daily.She reportedly had an MRI of the brain performed 02/13/13, which was unremarkable.She had an MRI of the brain without contrast performed on 02/28/13, which was normal.Carotid duplex performed 03/28/13 revealed no hemodynamically significant stenosis.MRA of the head was performed on 03/29/13, which revealed focal area of decreased signal in the left ICA, thought to be artifact.Blood work was performed on 03/16/13, which showed ANA negative, Sed Rate 3, and ACE 47.Symptoms had resolved and migraine was suspected. She was advised to start ASA 81mg  daily.  Starting in September 2015, she began experiencing similar episodes.At that time, she woke up one morning with headache, spinning sensation and nausea.She fell back to sleep and when she woke up 2 hours later, it had resolved.She later developed another  episode of dizziness, associated with headache. She also reported slight clumsiness of the right hand.During an episode, it is more difficult to write.It lasted 5 days.  She was evaluated for dizziness by Dr. Constance Holster, ENT.She was experiencing vertigo and imbalance with aural fullness and possible hearing loss.Exam was normal.   Personal history of headache:No but when she was 14 or 15, she was experiencing similar episodes of dizziness. Family history of headache:no  PAST MEDICAL HISTORY: Past Medical History:  Diagnosis Date  . Asthma   . GERD (gastroesophageal reflux disease)   . Gout   . Headache(784.0) 03/15/2013  . Hypothyroid   . IBS (irritable bowel syndrome)     MEDICATIONS: Current Outpatient Medications on File Prior to Visit  Medication Sig Dispense Refill  . acetaminophen (TYLENOL) 325 MG tablet Take 2 tablets (650 mg total) by mouth every 6 (six) hours as needed for mild pain or moderate pain. 30 tablet 1  . ADVAIR HFA 230-21 MCG/ACT inhaler     . BREO ELLIPTA 200-25 MCG/INH AEPB Take 1 puff by mouth daily.    . Calcium 200 MG TABS Take by mouth.    . Cholecalciferol (VITAMIN D3) 50 MCG (2000 UT) TABS Take by mouth.    . EPIPEN 2-PAK 0.3 MG/0.3ML SOAJ injection See admin instructions.  1  . escitalopram (LEXAPRO) 10 MG tablet Take 10 mg by mouth daily.  2  . fexofenadine-pseudoephedrine (ALLEGRA-D 24) 180-240 MG 24 hr tablet Take 1 tablet by mouth daily.    . Fluticasone Furoate (FLONASE SENSIMIST NA) Place into the nose.    . gabapentin (NEURONTIN) 300 MG capsule TAKE 2 CAPSULES (600 MG TOTAL) BY MOUTH AT BEDTIME. 180 capsule 1  . Lactobacillus Rhamnosus,  GG, (CULTURELLE) CAPS Take by mouth.    . levothyroxine (SYNTHROID, LEVOTHROID) 75 MCG tablet Take 75 mcg by mouth every other day.    . levothyroxine (SYNTHROID, LEVOTHROID) 88 MCG tablet Take 88 mcg by mouth daily before breakfast.    . LORazepam (ATIVAN) 0.5 MG tablet Take 1 tablet (0.5 mg total) by  mouth 2 (two) times daily as needed for anxiety (usually only uses for flight). 15 tablet 1  . meclizine (ANTIVERT) 25 MG tablet Take 1 tablet (25 mg total) by mouth 3 (three) times daily as needed for dizziness. (Patient not taking: Reported on 11/13/2018) 30 tablet 1  . Omega-3 Fatty Acids (FISH OIL ADULT GUMMIES PO) Take by mouth.    . Probiotic Product (PROBIOTIC ADVANCED PO) Take by mouth daily.    . Turmeric (QC TUMERIC COMPLEX PO) Take by mouth.    . vitamin B-12 (CYANOCOBALAMIN) 250 MCG tablet Take 250 mcg by mouth daily.    . Vitamins-Lipotropics (B-100) TABS Take by mouth.    Penne Lash HFA 45 MCG/ACT inhaler Inhale 2 puffs into the lungs as needed (SOB).      No current facility-administered medications on file prior to visit.    ALLERGIES: Allergies  Allergen Reactions  . Shellfish Allergy Nausea And Vomiting  . Penicillins Hives  . Sulfa Antibiotics Hives    FAMILY HISTORY: Family History  Problem Relation Age of Onset  . Heart disease Mother   . Heart disease Father   . Melanoma Father   . Heart disease Maternal Grandmother   . Cancer Maternal Grandfather        brain cancer  . Stroke Paternal Grandfather   . Cancer Paternal Grandmother        Breast cancer    SOCIAL HISTORY: Social History   Socioeconomic History  . Marital status: Married    Spouse name: Not on file  . Number of children: 3  . Years of education: post grad  . Highest education level: Not on file  Occupational History  . Occupation: Passenger transport manager: Psychologist, sport and exercise    Comment: [employer]  Tobacco Use  . Smoking status: Never Smoker  . Smokeless tobacco: Never Used  Substance and Sexual Activity  . Alcohol use: Yes    Alcohol/week: 1.0 standard drinks    Types: 1 Standard drinks or equivalent per week    Comment: rarely  . Drug use: No  . Sexual activity: Never    Partners: Male  Other Topics Concern  . Not on file  Social History Narrative  . Not on file   Social  Determinants of Health   Financial Resource Strain:   . Difficulty of Paying Living Expenses:   Food Insecurity:   . Worried About Charity fundraiser in the Last Year:   . Arboriculturist in the Last Year:   Transportation Needs:   . Film/video editor (Medical):   Marland Kitchen Lack of Transportation (Non-Medical):   Physical Activity:   . Days of Exercise per Week:   . Minutes of Exercise per Session:   Stress:   . Feeling of Stress :   Social Connections:   . Frequency of Communication with Friends and Family:   . Frequency of Social Gatherings with Friends and Family:   . Attends Religious Services:   . Active Member of Clubs or Organizations:   . Attends Archivist Meetings:   Marland Kitchen Marital Status:   Intimate Partner Violence:   .  Fear of Current or Ex-Partner:   . Emotionally Abused:   Marland Kitchen Physically Abused:   . Sexually Abused:    PHYSICAL EXAM: Blood pressure 116/75, pulse 65, height 5\' 2"  (1.575 m), weight 150 lb 12.8 oz (68.4 kg), SpO2 95 %. General: No acute distress.  Patient appears well-groomed.   Head:  Normocephalic/atraumatic Eyes:  Fundi examined but not visualized Neck: supple, no paraspinal tenderness, full range of motion Heart:  Regular rate and rhythm Lungs:  Clear to auscultation bilaterally Back: No paraspinal tenderness Neurological Exam: alert and oriented to person, place, and time. Attention span and concentration intact, recent and remote memory intact, fund of knowledge intact.  Speech fluent and not dysarthric, language intact.  CN II-XII intact. Bulk and tone normal, muscle strength 5/5 throughout.  Sensation to light touch, temperature and vibration intact.  Deep tendon reflexes 2+ throughout, toes downgoing.  Finger to nose and heel to shin testing intact.  Gait normal, Romberg negative.  IMPRESSION: Recurrence of clumsiness of right hand, dizziness, unsteadiness.  Not really episodic, making migraine less likely but not certain.  Bilateral hand  numbness may be carpal tunnel syndrome.  PLAN: 1.  I think we should get another MRI of the brain with and without contrast 2.  NCV-EMG upper extremities 3.  If she has recurrence of vertigo spells, consider starting zonisamide 4.  Further recommendations pending results.   5.  Follow up in 4 months.  Metta Clines, DO  CC:  Leeroy Cha, MD

## 2019-07-30 ENCOUNTER — Encounter: Payer: Self-pay | Admitting: Neurology

## 2019-07-30 ENCOUNTER — Other Ambulatory Visit: Payer: Self-pay

## 2019-07-30 ENCOUNTER — Ambulatory Visit (INDEPENDENT_AMBULATORY_CARE_PROVIDER_SITE_OTHER): Payer: BC Managed Care – PPO | Admitting: Neurology

## 2019-07-30 VITALS — BP 116/75 | HR 65 | Ht 62.0 in | Wt 150.8 lb

## 2019-07-30 DIAGNOSIS — R200 Anesthesia of skin: Secondary | ICD-10-CM | POA: Diagnosis not present

## 2019-07-30 DIAGNOSIS — R29898 Other symptoms and signs involving the musculoskeletal system: Secondary | ICD-10-CM

## 2019-07-30 DIAGNOSIS — R42 Dizziness and giddiness: Secondary | ICD-10-CM

## 2019-07-30 NOTE — Patient Instructions (Signed)
1.  We will check MRI of brain with and without contrast ([facility] open MRI) 2.  Will order nerve study of both upper extremities 3.  Further recommendations pending results. 4.  Follow up in 4 months.

## 2019-08-21 DIAGNOSIS — R531 Weakness: Secondary | ICD-10-CM | POA: Diagnosis not present

## 2019-08-21 DIAGNOSIS — R42 Dizziness and giddiness: Secondary | ICD-10-CM | POA: Diagnosis not present

## 2019-08-22 DIAGNOSIS — R202 Paresthesia of skin: Secondary | ICD-10-CM | POA: Diagnosis not present

## 2019-08-22 DIAGNOSIS — G43809 Other migraine, not intractable, without status migrainosus: Secondary | ICD-10-CM | POA: Diagnosis not present

## 2019-08-22 DIAGNOSIS — Z8249 Family history of ischemic heart disease and other diseases of the circulatory system: Secondary | ICD-10-CM | POA: Diagnosis not present

## 2019-08-29 ENCOUNTER — Telehealth: Payer: Self-pay

## 2019-08-29 NOTE — Telephone Encounter (Signed)
Spoke with pt and informed that MRI of the brain was normal per Dr Tomi Likens, she verbalized understanding.

## 2019-08-29 NOTE — Telephone Encounter (Signed)
-----   Message from Pieter Partridge, DO sent at 08/29/2019  7:17 AM EDT ----- MRI of brain is normal

## 2019-09-14 ENCOUNTER — Ambulatory Visit: Payer: BC Managed Care – PPO | Admitting: Neurology

## 2019-09-14 DIAGNOSIS — L821 Other seborrheic keratosis: Secondary | ICD-10-CM | POA: Diagnosis not present

## 2019-09-14 DIAGNOSIS — L578 Other skin changes due to chronic exposure to nonionizing radiation: Secondary | ICD-10-CM | POA: Diagnosis not present

## 2019-09-14 DIAGNOSIS — D485 Neoplasm of uncertain behavior of skin: Secondary | ICD-10-CM | POA: Diagnosis not present

## 2019-09-14 DIAGNOSIS — L570 Actinic keratosis: Secondary | ICD-10-CM | POA: Diagnosis not present

## 2019-09-14 DIAGNOSIS — D225 Melanocytic nevi of trunk: Secondary | ICD-10-CM | POA: Diagnosis not present

## 2019-09-14 DIAGNOSIS — L723 Sebaceous cyst: Secondary | ICD-10-CM | POA: Diagnosis not present

## 2019-09-18 ENCOUNTER — Other Ambulatory Visit: Payer: Self-pay

## 2019-09-18 ENCOUNTER — Ambulatory Visit (INDEPENDENT_AMBULATORY_CARE_PROVIDER_SITE_OTHER): Payer: BC Managed Care – PPO | Admitting: Neurology

## 2019-09-18 DIAGNOSIS — R29898 Other symptoms and signs involving the musculoskeletal system: Secondary | ICD-10-CM | POA: Diagnosis not present

## 2019-09-18 DIAGNOSIS — G5621 Lesion of ulnar nerve, right upper limb: Secondary | ICD-10-CM

## 2019-09-18 DIAGNOSIS — R200 Anesthesia of skin: Secondary | ICD-10-CM

## 2019-09-18 NOTE — Progress Notes (Signed)
Cardiology Office Note:   Date:  09/19/2019  NAME:  Katie Oneill    MRN: [id number] DOB:  1960/11/04   PCP:  Caren Macadam, MD  Cardiologist:  No primary care provider on file.   Referring MD: Caren Macadam, MD   Chief Complaint  Patient presents with  . family history of cad   History of Present Illness:   Katie Oneill is a 59 y.o. female with a hx of GERD, hypothyroidism who is being seen today for the evaluation of family history of CAD at the request of Caren Macadam, MD.  She reports that her brother recently had coronary artery bypass surgery at age 58.  Apparently this happened last month.  She now is a little more concerned about her overall heart health.  She reports she is been quite healthy her entire life.  She does have a history of anxiety which is well treated.  Her most recent lipid profile from her primary care physician shows a total cholesterol 172, HDL 79, LDL 78, triglycerides 79.  She also reports that her father had a heart attack at age 74 but he was a smoker.  She also reports her mother had congestive heart failure in her 66s.  She overall is concerned about her heart health.  She never had a heart attack or stroke.  She is a never smoker.  She consumes alcohol in moderation.  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  She reports that she does exercise routinely.  This includes walking 2 to 3 miles at a time without any chest pain or shortness of breath.  She has no cardiovascular symptoms that I can tell.  Her examination is benign.  She has no murmurs on exam.  Her EKG is very normal.  Past Medical History: Past Medical History:  Diagnosis Date  . Asthma   . GERD (gastroesophageal reflux disease)   . Gout   . Headache(784.0) 03/15/2013  . Hypothyroid   . IBS (irritable bowel syndrome)     Past Surgical History: Past Surgical History:  Procedure Laterality Date  . ABDOMINAL HYSTERECTOMY    . BUNIONECTOMY Left   . CHOLECYSTECTOMY N/A 06/20/2013     Procedure: LAPAROSCOPIC CHOLECYSTECTOMY WITH INTRAOPERATIVE CHOLANGIOGRAM;  Surgeon: Earnstine Regal, MD;  Location: [facility];  Service: General;  Laterality: N/A;  . ERCP N/A 06/19/2013   Procedure: ENDOSCOPIC RETROGRADE CHOLANGIOPANCREATOGRAPHY (ERCP);  Surgeon: Beryle Beams, MD;  Location: [facility];  Service: Endoscopy;  Laterality: N/A;  . NASAL SINUS SURGERY     x2  . STRABISMUS SURGERY    . TENDON REPAIR Right    hand  . TONSILLECTOMY  1967    Current Medications: Current Meds  Medication Sig  . acetaminophen (TYLENOL) 325 MG tablet Take 2 tablets (650 mg total) by mouth every 6 (six) hours as needed for mild pain or moderate pain.  Marland Kitchen ADVAIR HFA 230-21 MCG/ACT inhaler   . Calcium 200 MG TABS Take by mouth.  . Cholecalciferol (VITAMIN D3) 50 MCG (2000 UT) TABS Take by mouth.  . EPIPEN 2-PAK 0.3 MG/0.3ML SOAJ injection See admin instructions.  Marland Kitchen escitalopram (LEXAPRO) 10 MG tablet Take 10 mg by mouth daily.  . fexofenadine-pseudoephedrine (ALLEGRA-D 24) 180-240 MG 24 hr tablet Take 1 tablet by mouth daily.  . Fluticasone Furoate (FLONASE SENSIMIST NA) Place into the nose.  . gabapentin (NEURONTIN) 300 MG capsule TAKE 2 CAPSULES (600 MG TOTAL) BY MOUTH AT BEDTIME.  . Lactobacillus Rhamnosus, GG, (  CULTURELLE) CAPS Take by mouth.  . levothyroxine (SYNTHROID, LEVOTHROID) 75 MCG tablet Take 75 mcg by mouth every other day.  . levothyroxine (SYNTHROID, LEVOTHROID) 88 MCG tablet Take 88 mcg by mouth daily before breakfast.  . LORazepam (ATIVAN) 0.5 MG tablet Take 1 tablet (0.5 mg total) by mouth 2 (two) times daily as needed for anxiety (usually only uses for flight).  . meclizine (ANTIVERT) 25 MG tablet Take 1 tablet (25 mg total) by mouth 3 (three) times daily as needed for dizziness.  . Omega-3 Fatty Acids (FISH OIL ADULT GUMMIES PO) Take by mouth.  . Probiotic Product (PROBIOTIC ADVANCED PO) Take by mouth daily.  . Turmeric (QC TUMERIC COMPLEX PO) Take by mouth.  . vitamin B-12  (CYANOCOBALAMIN) 250 MCG tablet Take 250 mcg by mouth daily.  . Vitamins-Lipotropics (B-100) TABS Take by mouth.  Penne Lash HFA 45 MCG/ACT inhaler Inhale 2 puffs into the lungs as needed (SOB).      Allergies:    Shellfish allergy, Penicillins, and Sulfa antibiotics   Social History: Social History   Socioeconomic History  . Marital status: Married    Spouse name: Not on file  . Number of children: 3  . Years of education: post grad  . Highest education level: Not on file  Occupational History  . Occupation: Passenger transport manager: Psychologist, sport and exercise    Comment: [employer]  Tobacco Use  . Smoking status: Never Smoker  . Smokeless tobacco: Never Used  Vaping Use  . Vaping Use: Never used  Substance and Sexual Activity  . Alcohol use: Yes    Alcohol/week: 1.0 standard drink    Types: 1 Standard drinks or equivalent per week    Comment: rarely  . Drug use: No  . Sexual activity: Never    Partners: Male  Other Topics Concern  . Not on file  Social History Narrative   Right handed   Lives husband one story home   Social Determinants of Health   Financial Resource Strain:   . Difficulty of Paying Living Expenses:   Food Insecurity:   . Worried About Charity fundraiser in the Last Year:   . Arboriculturist in the Last Year:   Transportation Needs:   . Film/video editor (Medical):   Marland Kitchen Lack of Transportation (Non-Medical):   Physical Activity:   . Days of Exercise per Week:   . Minutes of Exercise per Session:   Stress:   . Feeling of Stress :   Social Connections:   . Frequency of Communication with Friends and Family:   . Frequency of Social Gatherings with Friends and Family:   . Attends Religious Services:   . Active Member of Clubs or Organizations:   . Attends Archivist Meetings:   Marland Kitchen Marital Status:      Family History: The patient's family history includes Cancer in her maternal grandfather and paternal grandmother; Heart attack (age  of onset: 26) in her brother; Heart disease in her maternal grandmother; Heart disease (age of onset: 27) in her father; Heart failure in her mother; Melanoma in her father; Stroke in her paternal grandfather.  ROS:   All other ROS reviewed and negative. Pertinent positives noted in the HPI.     EKGs/Labs/Other Studies Reviewed:   The following studies were personally reviewed by me today:  EKG:  EKG is ordered today.  The ekg ordered today demonstrates normal sinus rhythm, heart rate 63, no acute ST-T changes,  no evidence of prior infarction, and was personally reviewed by me.   Recent Labs: No results found for requested labs within last 8760 hours.   Recent Lipid Panel    Component Value Date/Time   CHOL 155 11/21/2013 1447   TRIG 85 11/21/2013 1447   HDL 71 11/21/2013 1447   CHOLHDL 2.2 11/21/2013 1447   VLDL 17 11/21/2013 1447   LDLCALC 67 11/21/2013 1447    Physical Exam:   VS:  BP 124/82   Pulse 63   Ht 5\' 2"  (1.575 m)   Wt 152 lb 3.2 oz (69 kg)   SpO2 99%   BMI 27.84 kg/m    Wt Readings from Last 3 Encounters:  09/19/19 152 lb 3.2 oz (69 kg)  07/30/19 150 lb 12.8 oz (68.4 kg)  11/13/18 148 lb (67.1 kg)    General: Well nourished, well developed, in no acute distress Heart: Atraumatic, normal size  Eyes: PEERLA, EOMI  Neck: Supple, no JVD Endocrine: No thryomegaly Cardiac: Normal S1, S2; RRR; no murmurs, rubs, or gallops Lungs: Clear to auscultation bilaterally, no wheezing, rhonchi or rales  Abd: Soft, nontender, no hepatomegaly  Ext: No edema, pulses 2+ Musculoskeletal: No deformities, BUE and BLE strength normal and equal Skin: Warm and dry, no rashes   Neuro: Alert and oriented to person, place, time, and situation, CNII-XII grossly intact, no focal deficits  Psych: Normal mood and affect   ASSESSMENT:   Jerzey Komperda is a 59 y.o. female who presents for the following: 1. Family history of heart disease     PLAN:   1. Family history of heart  disease -Recent brother with coronary artery bypass surgery.  Also has father who had heart attack at age 66.  Her 10-year ASCVD risk score is very low, 1.5%.  Her EKG today is normal.  Her cardiovascular exam is normal without murmurs rubs or gallops.  She has no evidence of cardiovascular disease on my exam.  She is in good health and can exercise at a good level without any chest pain or shortness of breath.  I have a very low suspicion for underlying cardiovascular disease.  We did discuss pursuing a coronary calcium score.  I think this would help further reassure her that she is very low risk for future heart disease events.  Her total cholesterol is high but her HDL cholesterol is 79.  She is overall very low risk with this as this is heart protective.  Her LDL cholesterol 78.  Overall I have a good feeling that she is extremely low risk for future cardiovascular disease and we will further reassure her with a coronary calcium score.  We will follow-up the results with her by phone and if she needs to see [place] back she will.  Disposition: Return if symptoms worsen or fail to improve.  Medication Adjustments/Labs and Tests Ordered: Current medicines are reviewed at length with the patient today.  Concerns regarding medicines are outlined above.  Orders Placed This Encounter  Procedures  . CT CARDIAC SCORING  . EKG 12-Lead   No orders of the defined types were placed in this encounter.   Patient Instructions  Medication Instructions:  The current medical regimen is effective;  continue present plan and medications.  *If you need a refill on your cardiac medications before your next appointment, please call your pharmacy*   Testing/Procedures: CALCIUM SCORE   Follow-Up: At [facility], you and your health needs are our priority.  As part of our  continuing mission to provide you with exceptional heart care, we have created designated Provider Care Teams.  These Care Teams include your  primary Cardiologist (physician) and Advanced Practice Providers (APPs -  Physician Assistants and Nurse Practitioners) who all work together to provide you with the care you need, when you need it.  We recommend signing up for the patient portal called "MyChart".  Sign up information is provided on this After Visit Summary.  MyChart is used to connect with patients for Virtual Visits (Telemedicine).  Patients are able to view lab/test results, encounter notes, upcoming appointments, etc.  Non-urgent messages can be sent to your provider as well.   To learn more about what you can do with MyChart, go to [email or web address].    Your next appointment:   As needed  The format for your next appointment:   In Person  Provider:   Eleonore Chiquito, MD        Signed, Addison Naegeli. Audie Box, [place]  [address], [place], [place] [address] [phone]  09/19/2019 10:08 AM

## 2019-09-18 NOTE — Procedures (Signed)
[facility]  [address]  [place], [place] [address] Tel: [phone] Fax:  [phone] Test Date:  09/18/2019  Patient: Katie Oneill DOB: [birth year] Physician: Narda Amber, DO  Sex: Female Height: 5\' 2"  Ref Phys: Metta Clines, D.O.  ID#: [id number] Temp: 33.0C Technician:    Patient Complaints: This is a 59 year old female referred for evaluation of bilateral hand paresthesias and clumsiness.  NCV & EMG Findings: Extensive electrodiagnostic testing of the right upper extremity and additional studies of the left shows:  1. Right median, ulnar, and mixed palmar sensory responses are within normal limits. 2. Bilateral median and left ulnar motor responses are within normal limits.  Right ulnar motor response shows slowed conduction velocity across the elbow (A Elbow-B Elbow, 40 m/s), with normal latency and amplitude. 3. There is no evidence of active or chronic motor axonal loss changes affecting any of the tested muscles.  Motor unit configuration and recruitment pattern is within normal limits  Impression: 1. Right ulnar neuropathy with slowing across the elbow, purely demyelinating, mild. 2. There is no evidence of carpal tunnel syndrome or cervical radiculopathy affecting either upper extremity.   ___________________________ Narda Amber, DO    Nerve Conduction Studies Anti Sensory Summary Table   Stim Site NR Peak (ms) Norm Peak (ms) P-T Amp (V) Norm P-T Amp  Left Median Anti Sensory (2nd Digit)  33C  Wrist    3.1 <3.6 42.5 >15  Right Median Anti Sensory (2nd Digit)  33C  Wrist    2.7 <3.6 49.0 >15  Left Ulnar Anti Sensory (5th Digit)  33C  Wrist    2.7 <3.1 35.2 >10  Right Ulnar Anti Sensory (5th Digit)  33C  Wrist    2.7 <3.1 38.7 >10   Motor Summary Table   Stim Site NR Onset (ms) Norm Onset (ms) O-P Amp (mV) Norm O-P Amp Site1 Site2 Delta-0 (ms) Dist (cm) Vel (m/s) Norm Vel (m/s)  Left Median Motor (Abd Poll Brev)  33C  Wrist    3.0  <4.0 10.8 >6 Elbow Wrist 5.4 27.0 50 >50  Elbow    8.4  10.1         Right Median Motor (Abd Poll Brev)  33C  Wrist    2.6 <4.0 13.9 >6 Elbow Wrist 4.9 25.0 51 >50  Elbow    7.5  13.4         Left Ulnar Motor (Abd Dig Minimi)  33C  Wrist    2.3 <3.1 12.6 >7 B Elbow Wrist 3.8 22.0 58 >50  B Elbow    6.1  12.3  A Elbow B Elbow 1.9 10.0 53 >50  A Elbow    8.0  11.6         Right Ulnar Motor (Abd Dig Minimi)  33C  Wrist    2.3 <3.1 11.5 >7 B Elbow Wrist 3.6 22.0 61 >50  B Elbow    5.9  10.5  A Elbow B Elbow 2.5 10.0 40 >50  A Elbow    8.4  9.8          Comparison Summary Table   Stim Site NR Peak (ms) Norm Peak (ms) P-T Amp (V) Site1 Site2 Delta-P (ms) Norm Delta (ms)  Left Median/Ulnar Palm Comparison (Wrist - 8cm)  33C  Median Palm    1.6 <2.2 76.3 Median Palm Ulnar Palm 0.1   Ulnar Palm    1.5 <2.2 40.5      Right Median/Ulnar Palm Comparison (Wrist -  8cm)  33C  Median Palm    1.5 <2.2 67.5 Median Palm Ulnar Palm 0.2   Ulnar Palm    1.7 <2.2 40.5       EMG   Side Muscle Ins Act Fibs Psw Fasc Number Recrt Dur Dur. Amp Amp. Poly Poly. Comment  Right 1stDorInt Nml Nml Nml Nml Nml Nml Nml Nml Nml Nml Nml Nml N/A  Right PronatorTeres Nml Nml Nml Nml Nml Nml Nml Nml Nml Nml Nml Nml N/A  Right Biceps Nml Nml Nml Nml Nml Nml Nml Nml Nml Nml Nml Nml N/A  Right Triceps Nml Nml Nml Nml Nml Nml Nml Nml Nml Nml Nml Nml N/A  Right Deltoid Nml Nml Nml Nml Nml Nml Nml Nml Nml Nml Nml Nml N/A  Right FlexCarpiUln Nml Nml Nml Nml Nml Nml Nml Nml Nml Nml Nml Nml N/A  Left 1stDorInt Nml Nml Nml Nml Nml Nml Nml Nml Nml Nml Nml Nml N/A  Left PronatorTeres Nml Nml Nml Nml Nml Nml Nml Nml Nml Nml Nml Nml N/A  Left Biceps Nml Nml Nml Nml Nml Nml Nml Nml Nml Nml Nml Nml N/A  Left Triceps Nml Nml Nml Nml Nml Nml Nml Nml Nml Nml Nml Nml N/A  Left Deltoid Nml Nml Nml Nml Nml Nml Nml Nml Nml Nml Nml Nml N/A      Waveforms:

## 2019-09-19 ENCOUNTER — Encounter: Payer: Self-pay | Admitting: Cardiovascular Disease

## 2019-09-19 ENCOUNTER — Ambulatory Visit (INDEPENDENT_AMBULATORY_CARE_PROVIDER_SITE_OTHER): Payer: BC Managed Care – PPO | Admitting: Cardiovascular Disease

## 2019-09-19 VITALS — BP 124/82 | HR 63 | Ht 62.0 in | Wt 152.2 lb

## 2019-09-19 DIAGNOSIS — Z8249 Family history of ischemic heart disease and other diseases of the circulatory system: Secondary | ICD-10-CM | POA: Diagnosis not present

## 2019-09-19 NOTE — Patient Instructions (Signed)
Medication Instructions:  The current medical regimen is effective;  continue present plan and medications.  *If you need a refill on your cardiac medications before your next appointment, please call your pharmacy*   Testing/Procedures: CALCIUM SCORE   Follow-Up: At [facility], you and your health needs are our priority.  As part of our continuing mission to provide you with exceptional heart care, we have created designated Provider Care Teams.  These Care Teams include your primary Cardiologist (physician) and Advanced Practice Providers (APPs -  Physician Assistants and Nurse Practitioners) who all work together to provide you with the care you need, when you need it.  We recommend signing up for the patient portal called "MyChart".  Sign up information is provided on this After Visit Summary.  MyChart is used to connect with patients for Virtual Visits (Telemedicine).  Patients are able to view lab/test results, encounter notes, upcoming appointments, etc.  Non-urgent messages can be sent to your provider as well.   To learn more about what you can do with MyChart, go to [email or web address].    Your next appointment:   As needed  The format for your next appointment:   In Person  Provider:   Fordland O'Neal, MD     

## 2019-09-21 ENCOUNTER — Telehealth: Payer: Self-pay

## 2019-09-21 NOTE — Telephone Encounter (Signed)
Telephone call to pt, No answer. LMOVM to call us back

## 2019-09-21 NOTE — Telephone Encounter (Signed)
-----   Message from Pieter Partridge, DO sent at 09/19/2019 12:06 PM EDT ----- Nerve study is unremarkable.  It shows mild pinching of the nerve over the elbow but that would not explain symptoms.  MRI of brain is also normal.  Therefore, I would like to get MRI of cervical spine with and without contrast to evaluate for something in the spinal cord (myelopathy)

## 2019-09-24 ENCOUNTER — Other Ambulatory Visit: Payer: Self-pay

## 2019-09-24 ENCOUNTER — Telehealth: Payer: Self-pay | Admitting: Neurology

## 2019-09-24 DIAGNOSIS — G959 Disease of spinal cord, unspecified: Secondary | ICD-10-CM

## 2019-09-24 NOTE — Telephone Encounter (Signed)
Patient called in on Friday afternoon and left a message. She is returning our call.

## 2019-09-27 ENCOUNTER — Other Ambulatory Visit: Payer: Self-pay

## 2019-09-27 ENCOUNTER — Ambulatory Visit (INDEPENDENT_AMBULATORY_CARE_PROVIDER_SITE_OTHER)
Admission: RE | Admit: 2019-09-27 | Discharge: 2019-09-27 | Disposition: A | Discharge status: Home / Self Care | Payer: Self-pay | Source: Ambulatory Visit | Attending: Cardiovascular Disease | Admitting: Cardiovascular Disease

## 2019-09-27 DIAGNOSIS — R911 Solitary pulmonary nodule: Secondary | ICD-10-CM | POA: Diagnosis not present

## 2019-09-27 DIAGNOSIS — Z8249 Family history of ischemic heart disease and other diseases of the circulatory system: Secondary | ICD-10-CM

## 2019-09-27 DIAGNOSIS — R918 Other nonspecific abnormal finding of lung field: Secondary | ICD-10-CM | POA: Diagnosis not present

## 2019-10-01 ENCOUNTER — Telehealth: Payer: Self-pay | Admitting: Neurology

## 2019-10-01 NOTE — Telephone Encounter (Signed)
Telephone call to pt, Advised referral faxed  over to [facility] on 09/24/19, Will call to check on referral.  Per [facility] no referral received,   please resend referral to [phone].   Referral refaxed.

## 2019-10-01 NOTE — Telephone Encounter (Signed)
Patient left voicemail stating she was told Dr Tomi Likens wanted her to have a MRI of her spine. She hasn't been contacted to get it scheduled yet and is checking on the status of it and where it was sent to. Please call.

## 2019-10-03 ENCOUNTER — Encounter: Payer: BC Managed Care – PPO | Admitting: Neurology

## 2019-10-11 ENCOUNTER — Encounter: Payer: Self-pay | Admitting: Neurology

## 2019-10-11 DIAGNOSIS — M47812 Spondylosis without myelopathy or radiculopathy, cervical region: Secondary | ICD-10-CM | POA: Diagnosis not present

## 2019-10-11 DIAGNOSIS — M4802 Spinal stenosis, cervical region: Secondary | ICD-10-CM | POA: Diagnosis not present

## 2019-10-12 ENCOUNTER — Telehealth: Payer: Self-pay | Admitting: Neurology

## 2019-10-12 NOTE — Telephone Encounter (Signed)
MRI of cervical spine from 10/11/2019 shows arthritic changes but nothing that would be causing balance issues or the hand weakness.  Source of her symptoms are unclear as there does not appear to be any structural abnormality of the brain or spinal cord.  I suppose persistent symptoms of migraine are possible.  Would she consider changing gabapentin to an alternative migraine preventative such as topiramate?

## 2019-10-15 NOTE — Telephone Encounter (Signed)
Pt advised of MRI results. Pt will think about the change of medication. Pt will call us back.

## 2019-10-17 ENCOUNTER — Telehealth: Payer: Self-pay | Admitting: Neurology

## 2019-10-17 ENCOUNTER — Other Ambulatory Visit: Payer: BC Managed Care – PPO

## 2019-10-17 DIAGNOSIS — U071 COVID-19: Secondary | ICD-10-CM | POA: Diagnosis not present

## 2019-10-17 NOTE — Telephone Encounter (Signed)
Patient was seen at [facility] for her scans on 10/11/19.

## 2019-11-12 ENCOUNTER — Ambulatory Visit: Payer: BC Managed Care – PPO | Admitting: Internal Medicine

## 2019-11-13 ENCOUNTER — Ambulatory Visit: Payer: BC Managed Care – PPO | Admitting: Internal Medicine

## 2019-11-21 ENCOUNTER — Other Ambulatory Visit: Payer: Self-pay | Admitting: Neurology

## 2019-11-22 DIAGNOSIS — H0014 Chalazion left upper eyelid: Secondary | ICD-10-CM | POA: Diagnosis not present

## 2019-11-26 ENCOUNTER — Ambulatory Visit (INDEPENDENT_AMBULATORY_CARE_PROVIDER_SITE_OTHER): Payer: BC Managed Care – PPO | Admitting: Internal Medicine

## 2019-11-26 ENCOUNTER — Other Ambulatory Visit: Payer: Self-pay | Admitting: Internal Medicine

## 2019-11-26 ENCOUNTER — Other Ambulatory Visit: Payer: Self-pay

## 2019-11-26 ENCOUNTER — Encounter: Payer: Self-pay | Admitting: Internal Medicine

## 2019-11-26 VITALS — BP 126/80 | HR 75 | Ht 62.0 in | Wt 149.2 lb

## 2019-11-26 DIAGNOSIS — E063 Autoimmune thyroiditis: Secondary | ICD-10-CM

## 2019-11-26 DIAGNOSIS — E041 Nontoxic single thyroid nodule: Secondary | ICD-10-CM

## 2019-11-26 LAB — TSH: TSH: 1.22 u[IU]/mL (ref 0.35–4.50)

## 2019-11-26 NOTE — Progress Notes (Signed)
Name: Katie Oneill  MRN/ DOB: [id number], 1960/05/09    Age/ Sex: 59 y.o., female     PCP: Katie Macadam, MD   Reason for Endocrinology Evaluation: Hypothyroidism     Initial Endocrinology Clinic Visit: 11/13/2018    PATIENT IDENTIFIER: Katie Oneill is a 60 y.o., female with a past medical history of Hypothyroidism and allergic rhinitis. She has followed with [facility] since 11/13/2018 for consultative assistance with management of her hypothyroidism   HISTORICAL SUMMARY:  She has been diagnosed with hashimoto's thyroiditis  Many years ago and has been on LT-4 replacement for years.    An ultrasound was performed in 2018 which demonstrated a right inferior 1.4 cm nodule with benign cytology. Repeat imaging in 2019 confirmed stability.   No prior radiation exposure Niece was diagnosed with  thyroid cancer and pt requested a referral to an endocrinologist.   SUBJECTIVE:    Today (11/26/2019):  Katie Oneill is here for hypothyroidism and thyroid nodule.   Denies local neck symptoms  Compliant with Lt-4 replacement   Weight gain that she attributes to COVID  Denies constipation Denies palpitations  Has anxiety   HISTORY:  Past Medical History:  Past Medical History:  Diagnosis Date   Asthma    GERD (gastroesophageal reflux disease)    Gout    Headache(784.0) 03/15/2013   Hypothyroid    IBS (irritable bowel syndrome)    Past Surgical History:  Past Surgical History:  Procedure Laterality Date   ABDOMINAL HYSTERECTOMY     BUNIONECTOMY Left    CHOLECYSTECTOMY N/A 06/20/2013   Procedure: LAPAROSCOPIC CHOLECYSTECTOMY WITH INTRAOPERATIVE CHOLANGIOGRAM;  Surgeon: Katie Regal, MD;  Location: [facility];  Service: General;  Laterality: N/A;   ERCP N/A 06/19/2013   Procedure: ENDOSCOPIC RETROGRADE CHOLANGIOPANCREATOGRAPHY (ERCP);  Surgeon: Katie Beams, MD;  Location: [facility];  Service: Endoscopy;  Laterality: N/A;   NASAL SINUS SURGERY       x2   STRABISMUS SURGERY     TENDON REPAIR Right    hand   TONSILLECTOMY  1967    Social History:  reports that she has never smoked. She has never used smokeless tobacco. She reports current alcohol use of about 1.0 standard drink of alcohol per week. She reports that she does not use drugs. Family History:  Family History  Problem Relation Age of Onset   Heart failure Mother    Heart disease Father 87   Melanoma Father    Heart attack Brother 63   Heart disease Maternal Grandmother    Cancer Maternal Grandfather        brain cancer   Stroke Paternal Grandfather    Cancer Paternal Grandmother        Breast cancer      HOME MEDICATIONS: Allergies as of 11/26/2019      Reactions   Shellfish Allergy Nausea And Vomiting   Penicillins Hives   Sulfa Antibiotics Hives      Medication List       Accurate as of November 26, 2019  9:18 AM. If you have any questions, ask your nurse or doctor.        acetaminophen 325 MG tablet Commonly known as: TYLENOL Take 2 tablets (650 mg total) by mouth every 6 (six) hours as needed for mild pain or moderate pain.   Advair HFA 230-21 MCG/ACT inhaler Generic drug: fluticasone-salmeterol   B-100 Tabs Take by mouth.   Calcium 200 MG Tabs Take by mouth.   Culturelle Caps  Take by mouth.   EpiPen 2-Pak 0.3 mg/0.3 mL Soaj injection Generic drug: EPINEPHrine See admin instructions.   escitalopram 10 MG tablet Commonly known as: LEXAPRO Take 10 mg by mouth daily.   fexofenadine-pseudoephedrine 180-240 MG 24 hr tablet Commonly known as: ALLEGRA-D 24 Take 1 tablet by mouth daily.   FISH OIL ADULT GUMMIES PO Take by mouth.   FLONASE SENSIMIST NA Place into the nose.   gabapentin 300 MG capsule Commonly known as: NEURONTIN TAKE 2 CAPSULES (600 MG TOTAL) BY MOUTH AT BEDTIME.   levothyroxine 88 MCG tablet Commonly known as: SYNTHROID Take 88 mcg by mouth daily before breakfast.   levothyroxine 75 MCG  tablet Commonly known as: SYNTHROID Take 75 mcg by mouth every other day.   LORazepam 0.5 MG tablet Commonly known as: ATIVAN Take 1 tablet (0.5 mg total) by mouth 2 (two) times daily as needed for anxiety (usually only uses for flight).   meclizine 25 MG tablet Commonly known as: ANTIVERT Take 1 tablet (25 mg total) by mouth 3 (three) times daily as needed for dizziness.   PROBIOTIC ADVANCED PO Take by mouth daily.   QC TUMERIC COMPLEX PO Take by mouth.   vitamin B-12 250 MCG tablet Commonly known as: CYANOCOBALAMIN Take 250 mcg by mouth daily.   Vitamin D3 50 MCG (2000 UT) Tabs Take by mouth.   Xopenex HFA 45 MCG/ACT inhaler Generic drug: levalbuterol Inhale 2 puffs into the lungs as needed (SOB).         OBJECTIVE:   PHYSICAL EXAM: VS: BP 126/80 (BP Location: Left Arm, Patient Position: Sitting, Cuff Size: Small)    Pulse 75    Ht 5\' 2"  (1.575 m)    Wt 149 lb 3.2 oz (67.7 kg)    SpO2 97%    BMI 27.29 kg/m    EXAM: General: Pt appears well and is in NAD  Neck: General: Supple without adenopathy. Thyroid: Thyroid size normal.  No goiter or nodules appreciated.  Lungs: Clear with good BS bilat with no rales, rhonchi, or wheezes  Heart: Auscultation: RRR.  Abdomen: Normoactive bowel sounds, soft, nontender, without masses or organomegaly palpable  Extremities:  BL LE: No pretibial edema normal ROM and strength.  Mental Status: Judgment, insight: Intact Orientation: Oriented to time, place, and person Mood and affect: No depression, anxiety, or agitation     DATA REVIEWED: Results for Katie, Oneill (MRN [id number]) as of 11/26/2019 12:37  Ref. Range 11/26/2019 09:29  TSH Latest Ref Range: 0.35 - 4.50 uIU/mL 1.22    FNA 12/01/2016 THYROID, FINE NEEDLE ASPIRATION, RLP (SPECIMEN 1 OF 1, COLLECTED 12/01/16): CONSISTENT WITH LYMPHOCYTIC (HASHIMOTO) THYROIDITIS IN THE PROPER CLINICAL CONTEXT (BETHESDA CATEGORY II).   ASSESSMENT / PLAN / RECOMMENDATIONS:    1. Hypothyroidism Secondary to Hashimoto's Thyroiditis :   - Clinically and biochemically euthyroid  -She is compliant with LT-4 replacement -No changes today   Medications : Alternating evothyroxine 88 mcg with 75 mcg daily    2. Right thyroid Nodule :   - No local neck symptoms  - S/P Benign FNA in 2018 - Repeat ultrasound in 2020 was stable  - Discussed repeating a thyroid ultrasound annually for a total of 5 yrs to establish stability.   F/U in 1 yr     Signed electronically by: Mack Guise, MD  [facility]  [address] [place], [place] [address] Phone: [phone] FAX: [phone]      CC: Katie Macadam,  MD [place] [address] Phone: [phone]  Fax: [phone]   Return to Endocrinology clinic as below: Future Appointments  Date Time Provider [place]  12/17/2019  8:30 AM Pieter Partridge, DO [facility] None

## 2019-11-26 NOTE — Patient Instructions (Signed)

## 2019-12-03 DIAGNOSIS — Z1231 Encounter for screening mammogram for malignant neoplasm of breast: Secondary | ICD-10-CM | POA: Diagnosis not present

## 2019-12-03 DIAGNOSIS — Z01419 Encounter for gynecological examination (general) (routine) without abnormal findings: Secondary | ICD-10-CM | POA: Diagnosis not present

## 2019-12-03 DIAGNOSIS — Z6827 Body mass index (BMI) 27.0-27.9, adult: Secondary | ICD-10-CM | POA: Diagnosis not present

## 2019-12-04 ENCOUNTER — Ambulatory Visit
Admission: RE | Admit: 2019-12-04 | Discharge: 2019-12-04 | Disposition: A | Discharge status: Home / Self Care | Payer: BC Managed Care – PPO | Source: Ambulatory Visit | Attending: Internal Medicine | Admitting: Internal Medicine

## 2019-12-04 DIAGNOSIS — E041 Nontoxic single thyroid nodule: Secondary | ICD-10-CM

## 2019-12-17 ENCOUNTER — Ambulatory Visit: Payer: BC Managed Care – PPO | Admitting: Neurology

## 2019-12-19 ENCOUNTER — Other Ambulatory Visit: Payer: Self-pay | Admitting: Internal Medicine

## 2019-12-19 DIAGNOSIS — E039 Hypothyroidism, unspecified: Secondary | ICD-10-CM

## 2019-12-19 NOTE — Telephone Encounter (Signed)
Ok to refill. It does not show it has bee refilled. Last seen on 11/26/2019. Next appointment on 11/27/2020

## 2019-12-23 ENCOUNTER — Other Ambulatory Visit: Payer: Self-pay | Admitting: Neurology

## 2019-12-24 NOTE — Telephone Encounter (Signed)
You can refill it to [facility]

## 2019-12-27 ENCOUNTER — Other Ambulatory Visit: Payer: Self-pay

## 2019-12-27 MED ORDER — GABAPENTIN 300 MG PO CAPS
600.0000 mg | ORAL_CAPSULE | Freq: Every day | ORAL | 5 refills | Status: DC
Start: 2019-12-27 — End: 2020-06-09

## 2019-12-27 NOTE — Telephone Encounter (Signed)
Patient called and left a message to check on the status of the request.  [facility] [address]

## 2019-12-28 NOTE — Telephone Encounter (Signed)
It appears to have been refilled with 5 additional refills yesterday

## 2020-02-08 DIAGNOSIS — M1812 Unilateral primary osteoarthritis of first carpometacarpal joint, left hand: Secondary | ICD-10-CM | POA: Diagnosis not present

## 2020-02-25 DIAGNOSIS — H524 Presbyopia: Secondary | ICD-10-CM | POA: Diagnosis not present

## 2020-02-25 DIAGNOSIS — H01004 Unspecified blepharitis left upper eyelid: Secondary | ICD-10-CM | POA: Diagnosis not present

## 2020-02-25 DIAGNOSIS — H01001 Unspecified blepharitis right upper eyelid: Secondary | ICD-10-CM | POA: Diagnosis not present

## 2020-02-25 DIAGNOSIS — H2513 Age-related nuclear cataract, bilateral: Secondary | ICD-10-CM | POA: Diagnosis not present

## 2020-03-11 ENCOUNTER — Telehealth: Payer: Self-pay | Admitting: Internal Medicine

## 2020-03-11 ENCOUNTER — Other Ambulatory Visit: Payer: Self-pay | Admitting: *Deleted

## 2020-03-11 DIAGNOSIS — E039 Hypothyroidism, unspecified: Secondary | ICD-10-CM

## 2020-03-11 MED ORDER — LEVOTHYROXINE SODIUM 88 MCG PO TABS: ORAL_TABLET | ORAL | 3 refills | Status: DC

## 2020-03-11 MED ORDER — LEVOTHYROXINE SODIUM 75 MCG PO TABS: ORAL_TABLET | ORAL | 3 refills | Status: DC

## 2020-03-11 NOTE — Telephone Encounter (Signed)
Pt called to request the following refills:   - levothyroxine (SYNTHROID) 75 MCG tablet - levothyroxine (SYNTHROID) 88 MCG tablet  PHARMACY: [id number] [address] [place] [address] Phone:  [phone]  Fax:  [phone]

## 2020-03-11 NOTE — Telephone Encounter (Signed)
Refill was already sent in a refill encounter

## 2020-04-21 DIAGNOSIS — J3089 Other allergic rhinitis: Secondary | ICD-10-CM | POA: Diagnosis not present

## 2020-04-21 DIAGNOSIS — J4540 Moderate persistent asthma, uncomplicated: Secondary | ICD-10-CM | POA: Diagnosis not present

## 2020-04-21 DIAGNOSIS — L209 Atopic dermatitis, unspecified: Secondary | ICD-10-CM | POA: Diagnosis not present

## 2020-04-21 DIAGNOSIS — J3081 Allergic rhinitis due to animal (cat) (dog) hair and dander: Secondary | ICD-10-CM | POA: Diagnosis not present

## 2020-06-08 ENCOUNTER — Encounter: Payer: Self-pay | Admitting: Physician Assistant

## 2020-06-08 ENCOUNTER — Telehealth: Payer: 59 | Admitting: Physician Assistant

## 2020-06-08 DIAGNOSIS — J45901 Unspecified asthma with (acute) exacerbation: Secondary | ICD-10-CM | POA: Diagnosis not present

## 2020-06-08 DIAGNOSIS — J40 Bronchitis, not specified as acute or chronic: Secondary | ICD-10-CM | POA: Diagnosis not present

## 2020-06-08 MED ORDER — BENZONATATE 100 MG PO CAPS: 100.0000 mg | ORAL_CAPSULE | Freq: Three times a day (TID) | ORAL | 0 refills | Status: DC | PRN

## 2020-06-08 MED ORDER — AZITHROMYCIN 250 MG PO TABS: ORAL_TABLET | ORAL | 0 refills | Status: DC

## 2020-06-08 MED ORDER — PREDNISONE 10 MG PO TABS: ORAL_TABLET | ORAL | 0 refills | Status: DC

## 2020-06-08 NOTE — Progress Notes (Signed)
Virtual Visit via Video   I connected with patient on 06/08/20 at  8:45 AM EDT by a video enabled telemedicine application and verified that I am speaking with the correct person using two identifiers.  Location patient: Home Location provider: [place] participating in the virtual visit: Patient, Provider  I discussed the limitations of evaluation and management by telemedicine and the availability of in person appointments. The patient expressed understanding and agreed to proceed.  Subjective:   HPI:   Patient presents via [place] today c/o problems with her asthma. Cold last week, worsened asthma with cough and congestion.  She has been using her nebulizer, not working. Also using Allegra in am, Benadryl at night, Flonase. Advair bid.  Negative COVID test at home Last Prednisone rx - Sept 2021.  Last antibiotic - unknown, maybe last year.  Allergist - Hardie Pulley. Next appt is in about 5 months. She typically receives a 5-day prednisone taper and Z-pack for these exacerbations  ROS:   See pertinent positives and negatives per HPI.  Patient Active Problem List   Diagnosis Date Noted  . Nasal valve collapse 11/17/2018  . Snoring 11/17/2018  . Right thyroid nodule 11/13/2018  . Hashimoto's thyroiditis 11/13/2018  . Vestibular migraine 11/06/2014  . Stabbing headache 11/06/2014  . Dysphonia 06/03/2014  . Allergic rhinitis 11/21/2013  . S/P hysterectomy  ovaries retained 11/21/2013  . Cholelithiasis with choledocholithiasis 06/20/2013  . Choledocholithiasis 06/19/2013  . Abdominal pain 06/19/2013  . Vertigo 01/20/2010  . Anxiety 01/08/2009  . Low back pain 04/20/2005  . Hypothyroidism  post thyroiditis per pt report 10/15/2003  . Asthma 09/11/2003  . Allergic rhinitis due to pollen 09/11/2003    Social History   Tobacco Use  . Smoking status: Never Smoker  . Smokeless tobacco: Never Used  Substance Use Topics  . Alcohol use: Yes     Alcohol/week: 1.0 standard drink    Types: 1 Standard drinks or equivalent per week    Comment: rarely    Current Outpatient Medications:  .  acetaminophen (TYLENOL) 325 MG tablet, Take 2 tablets (650 mg total) by mouth every 6 (six) hours as needed for mild pain or moderate pain., Disp: 30 tablet, Rfl: 1 .  ADVAIR HFA 154-00 MCG/ACT inhaler, , Disp: , Rfl:  .  Calcium 200 MG TABS, Take by mouth., Disp: , Rfl:  .  Cholecalciferol (VITAMIN D3) 50 MCG (2000 UT) TABS, Take by mouth., Disp: , Rfl:  .  EPIPEN 2-PAK 0.3 MG/0.3ML SOAJ injection, See admin instructions., Disp: , Rfl: 1 .  escitalopram (LEXAPRO) 10 MG tablet, Take 10 mg by mouth daily., Disp: , Rfl: 2 .  fexofenadine-pseudoephedrine (ALLEGRA-D 24) 180-240 MG 24 hr tablet, Take 1 tablet by mouth daily., Disp: , Rfl:  .  Fluticasone Furoate (FLONASE SENSIMIST NA), Place into the nose., Disp: , Rfl:  .  gabapentin (NEURONTIN) 300 MG capsule, Take 2 capsules (600 mg total) by mouth at bedtime., Disp: 60 capsule, Rfl: 5 .  Lactobacillus Rhamnosus, GG, (CULTURELLE) CAPS, Take by mouth., Disp: , Rfl:  .  levothyroxine (SYNTHROID) 75 MCG tablet, 1 TABLET ON AN EMPTY STOMACH IN THE MORNING EVERY OTHER DAY, ALTERNATING WITH 88MCG ORALLY 90 DAYS DX code E06.3, Disp: 45 tablet, Rfl: 3 .  levothyroxine (SYNTHROID) 88 MCG tablet, 1 TABLET ON AN EMPTY STOMACH IN THE MORNING EVERY OTHER DAY ALTERNATING WITH 75 MCG ORALLY 90 DAYS Dx code E06.3, Disp: 45 tablet, Rfl: 3 .  LORazepam (  ATIVAN) 0.5 MG tablet, Take 1 tablet (0.5 mg total) by mouth 2 (two) times daily as needed for anxiety (usually only uses for flight)., Disp: 15 tablet, Rfl: 1 .  meclizine (ANTIVERT) 25 MG tablet, Take 1 tablet (25 mg total) by mouth 3 (three) times daily as needed for dizziness., Disp: 30 tablet, Rfl: 1 .  Omega-3 Fatty Acids (FISH OIL ADULT GUMMIES PO), Take by mouth., Disp: , Rfl:  .  Probiotic Product (PROBIOTIC ADVANCED PO), Take by mouth daily., Disp: , Rfl:  .   Turmeric (QC TUMERIC COMPLEX PO), Take by mouth., Disp: , Rfl:  .  vitamin B-12 (CYANOCOBALAMIN) 250 MCG tablet, Take 250 mcg by mouth daily., Disp: , Rfl:  .  Vitamins-Lipotropics (B-100) TABS, Take by mouth., Disp: , Rfl:  .  XOPENEX HFA 45 MCG/ACT inhaler, Inhale 2 puffs into the lungs as needed (SOB). , Disp: , Rfl:   Allergies  Allergen Reactions  . Shellfish Allergy Nausea And Vomiting  . Penicillins Hives  . Sulfa Antibiotics Hives    Objective:   There were no vitals taken for this visit.  Patient is well-developed, well-nourished in no acute distress.  Resting comfortably at home.  Head is normocephalic, atraumatic.  No labored breathing.  Speech is clear and coherent with logical content.  Patient is alert and oriented at baseline.   Assessment and Plan:   Asthma Exacerbation Bronchitis. Continue home care measures: Nebulizer q 4-6 hours as needed, push fluids Start Prednisone taper and Z-Pack.  Benzonatate as needed q 8 hrs. F/u with allergist.    Marlin Canary, PA-C [facility] 06/08/2020

## 2020-06-09 ENCOUNTER — Other Ambulatory Visit: Payer: Self-pay | Admitting: Neurology

## 2020-08-01 NOTE — Progress Notes (Signed)
NEUROLOGY FOLLOW UP OFFICE NOTE  Channing Savich [id number]  Assessment/Plan:   1.Recurrent episodic dizziness - may be vestibular migraine vs eustachian tube dysfunction - overall stable 2. Bilateral hand clumsiness/tremor - usually with pain - may be related to arthritis  Refill gabapentin 600mg  at bedtime Follow up one year  Subjective:  Lequisha Cammack is a 60 year old right-handed woman with history of hypothyroidism and asthma who follows up for vestibular migraine, stabbing headache and thigh pain.   UPDATE: She is taking gabapentin 600mg  at bedtime.    Last seen a year ago.  At that time, she endorsed recurrence of her habitual symptoms: feeling off balance, difficulty grasping objects, frequently dropping objects, mild headache and dizziness.  In the morning endorsed numbness in hands when she woke up.  MRI of brain with and without contrast on 08/22/2019 was normal. Considering that the hand weakness and numbness may indicated carpal tunnel syndrome, she had NCV-EMG of the upper extremities on 09/18/2019 showed mild right ulnar neuropathy at the elbow but no evidence of CTS or cervical radiculopathy.  MRI of cervical spine on 10/11/2019 showed cervical spondylosis with mild spinal stenosis at C4-5 through C6-7 and neuroforaminal stenosis at left C5-6.  Symptoms did resolve.  She has had increased stress and symptoms returned last week.  She got dizzy and right ear pain.  Symptoms lasted a day.  Sometimes may see the right hand tremor but mostly pain in the hands.  Exercising with pilates.       Depression/anxiety:  No Sleep hygiene:  Variable   HISTORY: In November 2014, she started experiencing episodes of headache associated with slowed processing, clumsiness of her right hand, difficulty getting words out and unsteady gait, possibly veering towards the right.  The headaches were described as bi-temporal aching or stabbing pain, anywhere from 3 to 7/10.  For a period of 6 weeks, she  was getting them almost daily.  She reportedly had an MRI of the brain performed 02/13/13, which was unremarkable.  She had an MRI of the brain without contrast performed on 02/28/13, which was normal.  Carotid duplex performed 03/28/13 revealed no hemodynamically significant stenosis.  MRA of the head was performed on 03/29/13, which revealed focal area of decreased signal in the left ICA, thought to be artifact.  Blood work was performed on 03/16/13, which showed ANA negative, Sed Rate 3, and ACE 47.  Symptoms had resolved and migraine was suspected. She was advised to start ASA 81mg  daily.   Starting in September 2015, she began experiencing similar episodes.  At that time, she woke up one morning with headache, spinning sensation and nausea.  She fell back to sleep and when she woke up 2 hours later, it had resolved.  She later developed another episode of dizziness, associated with headache. She also reported slight clumsiness of the right hand.  During an episode, it is more difficult to write.  It lasted 5 days.     She was evaluated for dizziness by Dr. Constance Holster, ENT.  She was experiencing vertigo and imbalance with aural fullness and possible hearing loss.  Exam was normal.      Personal history of headache:  No but when she was 14 or 15, she was experiencing similar episodes of dizziness. Family history of headache:  no  PAST MEDICAL HISTORY: Past Medical History:  Diagnosis Date   Asthma    GERD (gastroesophageal reflux disease)    Gout    Headache(784.0) 03/15/2013   Hypothyroid  IBS (irritable bowel syndrome)     MEDICATIONS: Current Outpatient Medications on File Prior to Visit  Medication Sig Dispense Refill   acetaminophen (TYLENOL) 325 MG tablet Take 2 tablets (650 mg total) by mouth every 6 (six) hours as needed for mild pain or moderate pain. 30 tablet 1   ADVAIR HFA 230-21 MCG/ACT inhaler      azithromycin (ZITHROMAX) 250 MG tablet Take 2 tabs PO x 1 dose, then 1 tab PO QD x 4  days 6 tablet 0   benzonatate (TESSALON) 100 MG capsule Take 1-2 capsules (100-200 mg total) by mouth 3 (three) times daily as needed for cough. 40 capsule 0   Calcium 200 MG TABS Take by mouth.     Cholecalciferol (VITAMIN D3) 50 MCG (2000 UT) TABS Take by mouth.     EPIPEN 2-PAK 0.3 MG/0.3ML SOAJ injection See admin instructions.  1   escitalopram (LEXAPRO) 10 MG tablet Take 10 mg by mouth daily.  2   fexofenadine-pseudoephedrine (ALLEGRA-D 24) 180-240 MG 24 hr tablet Take 1 tablet by mouth daily.     Fluticasone Furoate (FLONASE SENSIMIST NA) Place into the nose.     gabapentin (NEURONTIN) 300 MG capsule TAKE 2 CAPSULES BY MOUTH AT BEDTIME. 60 capsule 1   Lactobacillus Rhamnosus, GG, (CULTURELLE) CAPS Take by mouth.     levothyroxine (SYNTHROID) 75 MCG tablet 1 TABLET ON AN EMPTY STOMACH IN THE MORNING EVERY OTHER DAY, ALTERNATING WITH 88MCG ORALLY 90 DAYS DX code E06.3 45 tablet 3   levothyroxine (SYNTHROID) 88 MCG tablet 1 TABLET ON AN EMPTY STOMACH IN THE MORNING EVERY OTHER DAY ALTERNATING WITH 75 MCG ORALLY 90 DAYS Dx code E06.3 45 tablet 3   LORazepam (ATIVAN) 0.5 MG tablet Take 1 tablet (0.5 mg total) by mouth 2 (two) times daily as needed for anxiety (usually only uses for flight). 15 tablet 1   meclizine (ANTIVERT) 25 MG tablet Take 1 tablet (25 mg total) by mouth 3 (three) times daily as needed for dizziness. 30 tablet 1   Omega-3 Fatty Acids (FISH OIL ADULT GUMMIES PO) Take by mouth.     predniSONE (DELTASONE) 10 MG tablet Day 1: 2 tablets before breakfast, 1 after both lunch & dinner and 2 at bedtime; Day 2: 1 tab before breakfast, 1 after both lunch & dinner and 2 at bedtime; Day 3: 1 tab at each meal & 1 at bedtime; Day 4: 1 tab at breakfast, 1 at lunch, 1 at bedtime; Day 5: 1 tab at breakfast & 1 tab at bedtime; Day 6: 1 tab at breakfast 21 tablet 0   Probiotic Product (PROBIOTIC ADVANCED PO) Take by mouth daily.     Turmeric (QC TUMERIC COMPLEX PO) Take by mouth.     vitamin B-12  (CYANOCOBALAMIN) 250 MCG tablet Take 250 mcg by mouth daily.     Vitamins-Lipotropics (B-100) TABS Take by mouth.     XOPENEX HFA 45 MCG/ACT inhaler Inhale 2 puffs into the lungs as needed (SOB).      No current facility-administered medications on file prior to visit.    ALLERGIES: Allergies  Allergen Reactions   Shellfish Allergy Nausea And Vomiting   Penicillins Hives   Sulfa Antibiotics Hives    FAMILY HISTORY: Family History  Problem Relation Age of Onset   Heart failure Mother    Heart disease Father 31   Melanoma Father    Heart attack Brother 64   Heart disease Maternal Grandmother    Cancer Maternal Grandfather  brain cancer   Stroke Paternal Grandfather    Cancer Paternal Grandmother        Breast cancer       Objective:  Blood pressure 131/73, pulse 69, height 5\' 2"  (1.575 m), weight 158 lb 3.2 oz (71.8 kg), SpO2 99 %. General: No acute distress.  Patient appears well-groomed.   Head:  Normocephalic/atraumatic Eyes:  Fundi examined but not visualized Neck: supple, no paraspinal tenderness, full range of motion Heart:  Regular rate and rhythm Lungs:  Clear to auscultation bilaterally Back: No paraspinal tenderness Neurological Exam: alert and oriented to person, place, and time.  Speech fluent and not dysarthric, language intact.  CN II-XII intact. Bulk and tone normal, muscle strength 5/5 throughout.  Sensation to light touch intact.  Deep tendon reflexes 2+ throughout.  Finger to nose testing intact.  Gait normal, Romberg negative.   Metta Clines, DO  CC: Caren Macadam, MD

## 2020-08-04 ENCOUNTER — Encounter: Payer: Self-pay | Admitting: Neurology

## 2020-08-04 ENCOUNTER — Ambulatory Visit (INDEPENDENT_AMBULATORY_CARE_PROVIDER_SITE_OTHER): Payer: 59 | Admitting: Neurology

## 2020-08-04 ENCOUNTER — Other Ambulatory Visit: Payer: Self-pay

## 2020-08-04 VITALS — BP 131/73 | HR 69 | Ht 62.0 in | Wt 158.2 lb

## 2020-08-04 DIAGNOSIS — R200 Anesthesia of skin: Secondary | ICD-10-CM

## 2020-08-04 DIAGNOSIS — R42 Dizziness and giddiness: Secondary | ICD-10-CM | POA: Diagnosis not present

## 2020-08-04 MED ORDER — GABAPENTIN 300 MG PO CAPS: 2.0000 | ORAL_CAPSULE | Freq: Every day | ORAL | 1 refills | Status: DC

## 2020-08-04 NOTE — Patient Instructions (Signed)
Refilled gabapentin Follow up one year

## 2020-09-23 IMAGING — CT CT CARDIAC CORONARY ARTERY CALCIUM SCORE
3 series · 14 of 20 positions shown, 15 images · non-contrast
Comparison: None.
COMPARISON: None.

Addendum:
EXAM:
OVER-READ INTERPRETATION  CT CHEST

The following report is an over-read performed by radiologist Dr.
Ordunun Jan [REDACTED] on 09/27/2019. This over-read
does not include interpretation of cardiac or coronary anatomy or
pathology. The coronary calcium score interpretation by the
cardiologist is attached.
CLINICAL DATA: 59F with GERD and family history of CAD for risk
stratification
Coronary Calcium Score
TECHNIQUE: The patient was scanned on a Siemens Force scanner. Axial
non-contrast 3 mm slices were carried out through the heart. The
data set was analyzed on a dedicated work station and scored using
the Agatson method.

[Series 2: casc 3.0 bv41 2 bestdiast 71 % · axial · 0.27mm/px · z∈[-206,-134]mm · 4 of 42 slices shown, 5 images]
[im 9/42  vessel]
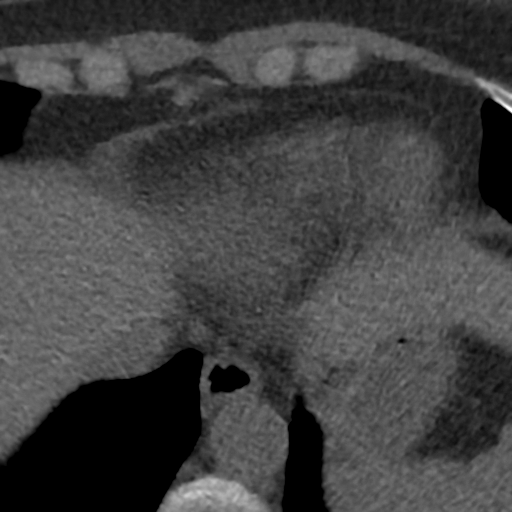
[im 9/42  lung]
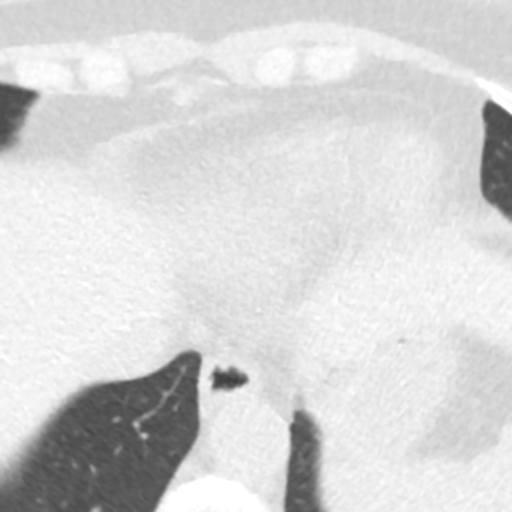
[im 17/42  vessel]
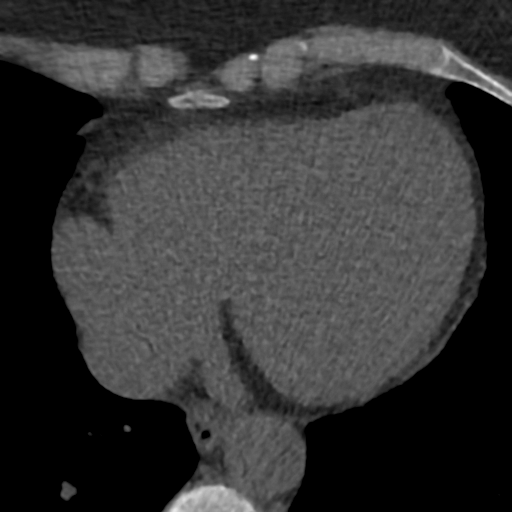
[im 25/42  vessel]
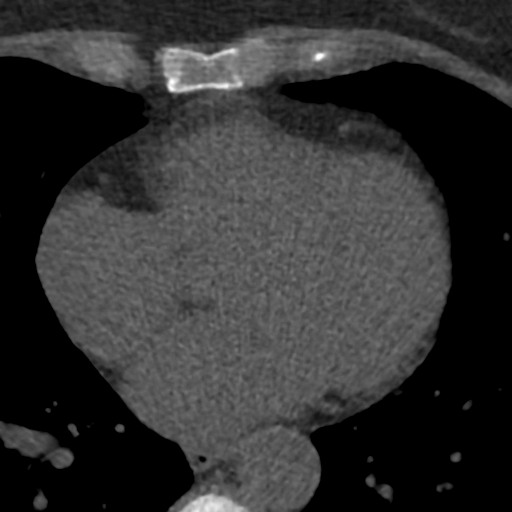
[im 33/42  vessel]
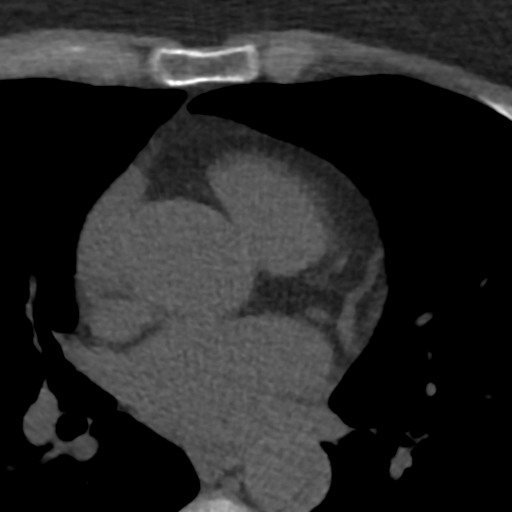

[Series 3: lung 71 % · axial · 0.56mm/px · z∈[-212,-128]mm · 5 of 42 slices shown]
[im 7/42  lung]
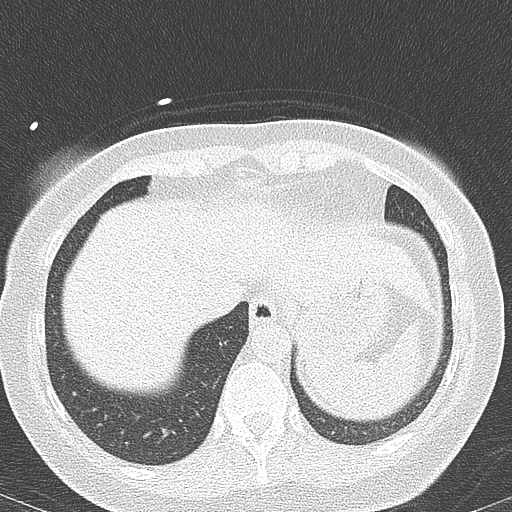
[im 14/42  lung]
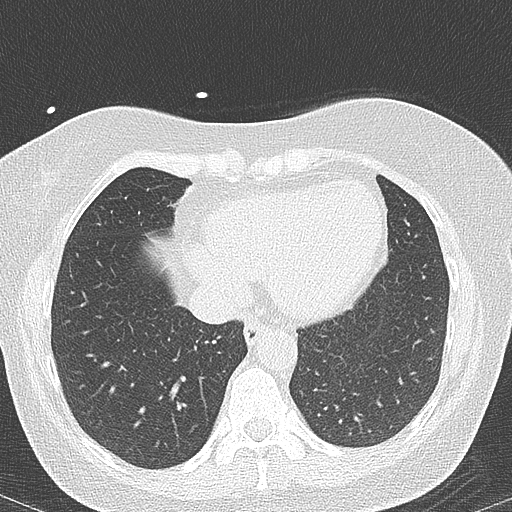
[im 21/42  lung]
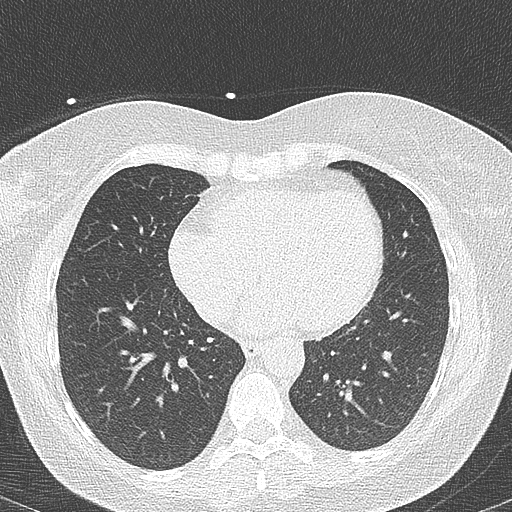
[im 28/42  lung]
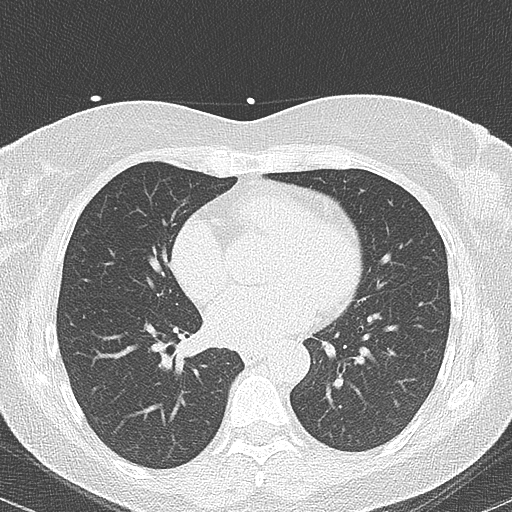
[im 35/42  lung]
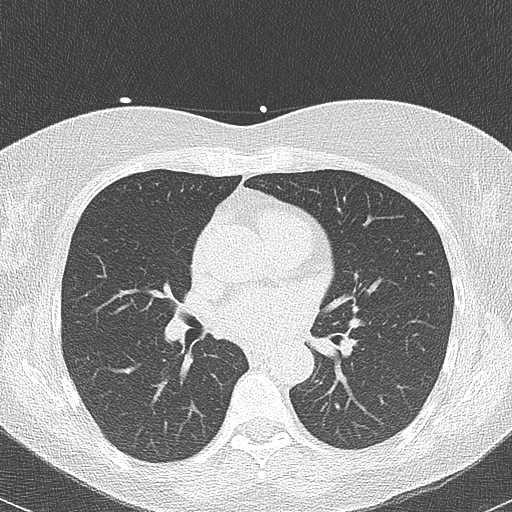

[Series 4: lung st 71 % · axial · 0.56mm/px · z∈[-212,-128]mm · 5 of 42 slices shown]
[im 7/42  lung]
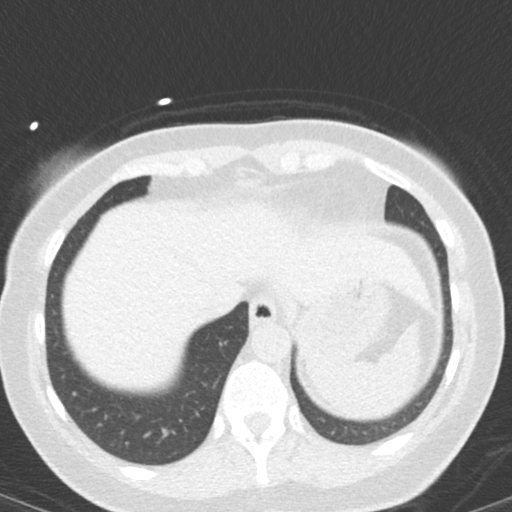
[im 14/42  lung]
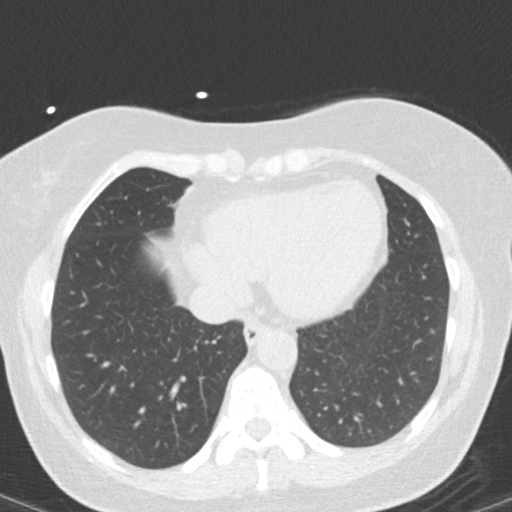
[im 21/42  lung]
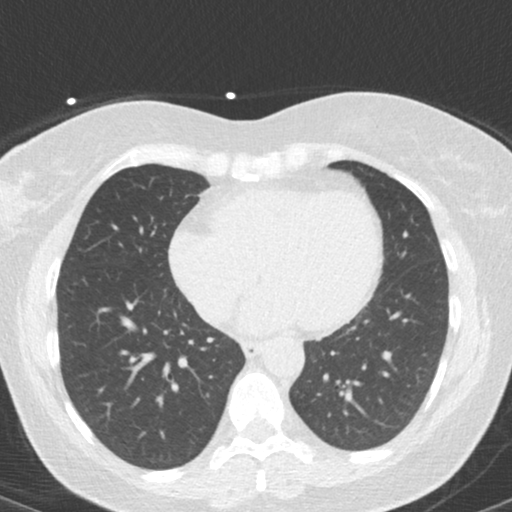
[im 28/42  lung]
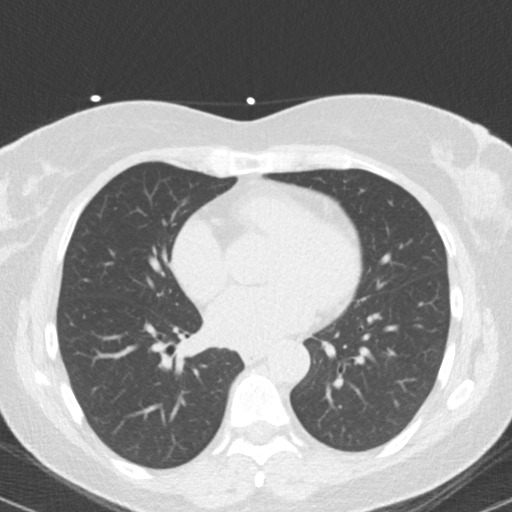
[im 35/42  lung]
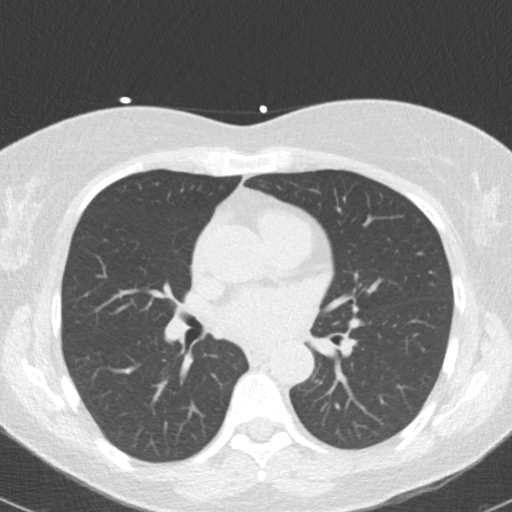

[14 of 20 positions shown; findings below may reference images not displayed]

FINDINGS: Vascular: Heart is normal size.  Visualized aorta normal caliber.

Mediastinum/Nodes: No adenopathy in the lower mediastinum or hila.

Lungs/Pleura: 3 mm nodule along the right minor fissure anteriorly
on image 11. Lungs otherwise clear. No effusions.

Upper Abdomen: No acute findings.

Musculoskeletal: Chest wall soft tissues are unremarkable. No acute
bony abnormality.
IMPRESSION: 3 mm nodule in the right lung along the minor fissure. No follow-up
needed if patient is low-risk. Non-contrast chest CT can be
considered in 12 months if patient is high-risk. This recommendation
follows the consensus statement: Guidelines for Management of
Incidental Pulmonary Nodules Detected on CT Images: From the
FINDINGS: Non-cardiac: See separate report from [REDACTED].

Ascending Aorta: Normal.  3.4 cm.  No calcification.

Pericardium: Normal

Coronary arteries: Normal anatomy.
IMPRESSION: Coronary calcium score of 0. This was 0 percentile for age and sex
matched control.

No evidence of coronary or aorta calcification.

*** End of Addendum ***
EXAM:
OVER-READ INTERPRETATION  CT CHEST

The following report is an over-read performed by radiologist Dr.
Ordunun Jan [REDACTED] on 09/27/2019. This over-read
does not include interpretation of cardiac or coronary anatomy or
pathology. The coronary calcium score interpretation by the
cardiologist is attached.
FINDINGS: Vascular: Heart is normal size.  Visualized aorta normal caliber.

Mediastinum/Nodes: No adenopathy in the lower mediastinum or hila.

Lungs/Pleura: 3 mm nodule along the right minor fissure anteriorly
on image 11. Lungs otherwise clear. No effusions.

Upper Abdomen: No acute findings.

Musculoskeletal: Chest wall soft tissues are unremarkable. No acute
bony abnormality.
IMPRESSION: 3 mm nodule in the right lung along the minor fissure. No follow-up
needed if patient is low-risk. Non-contrast chest CT can be
considered in 12 months if patient is high-risk. This recommendation
follows the consensus statement: Guidelines for Management of
Incidental Pulmonary Nodules Detected on CT Images: From the

## 2020-10-28 DIAGNOSIS — Z91018 Allergy to other foods: Secondary | ICD-10-CM | POA: Diagnosis not present

## 2020-11-12 DIAGNOSIS — L03113 Cellulitis of right upper limb: Secondary | ICD-10-CM | POA: Diagnosis not present

## 2020-11-27 ENCOUNTER — Ambulatory Visit (INDEPENDENT_AMBULATORY_CARE_PROVIDER_SITE_OTHER): Payer: BC Managed Care – PPO | Admitting: Internal Medicine

## 2020-11-27 ENCOUNTER — Encounter: Payer: Self-pay | Admitting: Internal Medicine

## 2020-11-27 ENCOUNTER — Other Ambulatory Visit: Payer: Self-pay

## 2020-11-27 VITALS — BP 110/68 | HR 71 | Ht 62.0 in | Wt 155.2 lb

## 2020-11-27 DIAGNOSIS — E063 Autoimmune thyroiditis: Secondary | ICD-10-CM | POA: Diagnosis not present

## 2020-11-27 DIAGNOSIS — E041 Nontoxic single thyroid nodule: Secondary | ICD-10-CM

## 2020-11-27 DIAGNOSIS — E039 Hypothyroidism, unspecified: Secondary | ICD-10-CM | POA: Diagnosis not present

## 2020-11-27 LAB — TSH: TSH: 0.59 u[IU]/mL (ref 0.35–5.50)

## 2020-11-27 MED ORDER — LEVOTHYROXINE SODIUM 75 MCG PO TABS: ORAL_TABLET | ORAL | 3 refills | Status: DC

## 2020-11-27 MED ORDER — LEVOTHYROXINE SODIUM 88 MCG PO TABS: ORAL_TABLET | ORAL | 3 refills | Status: DC

## 2020-11-27 NOTE — Progress Notes (Signed)
Name: Katie Oneill  MRN/ DOB: [id number], 1961-02-04    Age/ Sex: 60 y.o., female     PCP: Katie Macadam, MD   Reason for Endocrinology Evaluation: Hypothyroidism     Initial Endocrinology Clinic Visit: 11/13/2018    PATIENT IDENTIFIER: Katie Oneill is a 60 y.o., female with a past medical history of Hypothyroidism and allergic rhinitis. She has followed with [facility] since 11/13/2018 for consultative assistance with management of her hypothyroidism   HISTORICAL SUMMARY:  She has been diagnosed with hashimoto's thyroiditis  Many years ago and has been on LT-4 replacement for years.      An ultrasound was performed in 2018 which demonstrated a right inferior 1.4 cm nodule with benign cytology. Repeat imaging in 2019 confirmed stability.    No prior radiation exposure Niece was diagnosed with  thyroid cancer and pt requested a referral to an endocrinologist.   SUBJECTIVE:    Today (11/27/2020):  Katie Oneill is here for hypothyroidism and thyroid nodule.    Denies local neck symptoms  Weight has been fluctuating , currently on a strict weight on strict diet   Compliant with Lt-4 replacement   Denies constipation  or diarrhea  Denies palpitations     Levothyroxine  88 mcg Sunday, Tuesday, Thursday and Saturday  Levothyroxine 75 mcg rest of the week   HISTORY:  Past Medical History:  Past Medical History:  Diagnosis Date   Asthma    GERD (gastroesophageal reflux disease)    Gout    Headache(784.0) 03/15/2013   Hypothyroid    IBS (irritable bowel syndrome)    Past Surgical History:  Past Surgical History:  Procedure Laterality Date   ABDOMINAL HYSTERECTOMY     BUNIONECTOMY Left    CHOLECYSTECTOMY N/A 06/20/2013   Procedure: LAPAROSCOPIC CHOLECYSTECTOMY WITH INTRAOPERATIVE CHOLANGIOGRAM;  Surgeon: Katie Regal, MD;  Location: [facility];  Service: General;  Laterality: N/A;   ERCP N/A 06/19/2013   Procedure: ENDOSCOPIC RETROGRADE  CHOLANGIOPANCREATOGRAPHY (ERCP);  Surgeon: Katie Beams, MD;  Location: [facility];  Service: Endoscopy;  Laterality: N/A;   NASAL SINUS SURGERY     x2   STRABISMUS SURGERY     TENDON REPAIR Right    hand   TONSILLECTOMY  1967   Social History:  reports that she has never smoked. She has never used smokeless tobacco. She reports current alcohol use of about 1.0 standard drink per week. She reports that she does not use drugs. Family History:  Family History  Problem Relation Age of Onset   Heart failure Mother    Heart disease Father 39   Melanoma Father    Heart attack Brother 56   Heart disease Maternal Grandmother    Cancer Maternal Grandfather        brain cancer   Stroke Paternal Grandfather    Cancer Paternal Grandmother        Breast cancer      HOME MEDICATIONS: Allergies as of 11/27/2020       Reactions   Shellfish Allergy Nausea And Vomiting   Penicillins Hives   Sulfa Antibiotics Hives        Medication List        Accurate as of November 27, 2020  7:51 AM. If you have any questions, ask your nurse or doctor.          acetaminophen 325 MG tablet Commonly known as: TYLENOL Take 2 tablets (650 mg total) by mouth every 6 (six) hours as needed for  mild pain or moderate pain.   Advair HFA 230-21 MCG/ACT inhaler Generic drug: fluticasone-salmeterol   B-100 Tabs Take by mouth.   benzonatate 100 MG capsule Commonly known as: TESSALON Take 1-2 capsules (100-200 mg total) by mouth 3 (three) times daily as needed for cough.   Calcium 200 MG Tabs Take by mouth.   Culturelle Caps Take by mouth.   EpiPen 2-Pak 0.3 mg/0.3 mL Soaj injection Generic drug: EPINEPHrine See admin instructions.   escitalopram 10 MG tablet Commonly known as: LEXAPRO Take 10 mg by mouth daily.   fexofenadine-pseudoephedrine 180-240 MG 24 hr tablet Commonly known as: ALLEGRA-D 24 Take 1 tablet by mouth daily.   FISH OIL ADULT GUMMIES PO Take by mouth.   FLONASE  SENSIMIST NA Place into the nose.   gabapentin 300 MG capsule Commonly known as: NEURONTIN Take 2 capsules (600 mg total) by mouth at bedtime.   levothyroxine 75 MCG tablet Commonly known as: SYNTHROID 1 TABLET ON AN EMPTY STOMACH IN THE MORNING EVERY OTHER DAY, ALTERNATING WITH 88MCG ORALLY 90 DAYS DX code E06.3   levothyroxine 88 MCG tablet Commonly known as: SYNTHROID 1 TABLET ON AN EMPTY STOMACH IN THE MORNING EVERY OTHER DAY ALTERNATING WITH 75 MCG ORALLY 90 DAYS Dx code E06.3   LORazepam 0.5 MG tablet Commonly known as: ATIVAN Take 1 tablet (0.5 mg total) by mouth 2 (two) times daily as needed for anxiety (usually only uses for flight).   meclizine 25 MG tablet Commonly known as: ANTIVERT Take 1 tablet (25 mg total) by mouth 3 (three) times daily as needed for dizziness.   PROBIOTIC ADVANCED PO Take by mouth daily.   QC TUMERIC COMPLEX PO Take by mouth.   vitamin B-12 250 MCG tablet Commonly known as: CYANOCOBALAMIN Take 250 mcg by mouth daily.   Vitamin D3 50 MCG (2000 UT) Tabs Take by mouth.   Xopenex HFA 45 MCG/ACT inhaler Generic drug: levalbuterol Inhale 2 puffs into the lungs as needed (SOB).          OBJECTIVE:   PHYSICAL EXAM: VS: BP 110/68 (BP Location: Left Arm, Patient Position: Sitting, Cuff Size: Small)   Pulse 71   Ht 5\' 2"  (1.575 m)   Wt 155 lb 3.2 oz (70.4 kg)   SpO2 99%   BMI 28.39 kg/m    EXAM: General: Pt appears well and is in NAD  Neck: General: Supple without adenopathy. Thyroid: Thyroid size normal.  No goiter or nodules appreciated.  Lungs: Clear with good BS bilat with no rales, rhonchi, or wheezes  Heart: Auscultation: RRR.  Abdomen: Normoactive bowel sounds, soft, nontender, without masses or organomegaly palpable  Extremities:  BL LE: No pretibial edema normal ROM and strength.  Mental Status: Judgment, insight: Intact Orientation: Oriented to time, place, and person Mood and affect: No depression, anxiety, or  agitation     DATA REVIEWED: Results for Katie, Oneill (MRN [id number]) as of 11/27/2020 12:47  Ref. Range 11/27/2020 08:02  TSH Latest Ref Range: 0.35 - 5.50 uIU/mL 0.59    Thyroid Ultrasound 12/04/2019 Estimated total number of nodules >/= 1 cm: 1   Number of spongiform nodules >/=  2 cm not described below (TR1): 0   Number of mixed cystic and solid nodules >/= 1.5 cm not described below ([place]): 0   _________________________________________________________   Again noted is a hyperechoic solid nodule in the right inferior thyroid lobe that was previously biopsied. The nodule measures 1.2 x 0.6 x 0.6 cm and previously measured 1.2  x 0.6 x 0.6 cm. Morphology of the nodule is unchanged. No new thyroid nodules.   IMPRESSION: 1. Stable thyroid ultrasound. 2. Thyroid tissue is markedly heterogeneous with a stable 1.2 cm nodule in the right thyroid lobe. No new suspicious thyroid nodules.   FNA 12/01/2016 THYROID, FINE NEEDLE ASPIRATION, RLP (SPECIMEN 1 OF 1, COLLECTED 12/01/16): CONSISTENT WITH LYMPHOCYTIC (HASHIMOTO) THYROIDITIS IN THE PROPER CLINICAL CONTEXT (BETHESDA CATEGORY II).    ASSESSMENT / PLAN / RECOMMENDATIONS:   Hypothyroidism Secondary to Hashimoto's Thyroiditis :    - Clinically and biochemically euthyroid  -She is compliant with LT-4 replacement -No changes today     Medications : Alternating evothyroxine 88 mcg with 75 mcg daily      2. Right thyroid Nodule :    - No local neck symptoms  - S/P Benign FNA in 2018 - Repeat ultrasound has been stable, thyroid ultrasound has been ordered for this year - Discussed repeating a thyroid ultrasound annually for a total of 5 yrs to establish stability.    3. Weigh Gain:  -This has been concerning to her, she recently started a low-carb diet through her chiropractor's office.  I have encouraged her to continue with lifestyle changes will also discussed the importance of emphasizing healthy life habits rather  than monitoring the scale as that can be stressful.  F/U in 1 yr     Signed electronically by: Mack Guise, MD  [facility]  [facility] [address] [place], [place] [address] Phone: [phone] FAX: [phone]      CC: Katie Oneill, [place] [address] Phone: [phone]  Fax: [phone]   Return to Endocrinology clinic as below: Future Appointments  Date Time Provider [place]  08/04/2021  8:30 AM Pieter Partridge, DO [facility] None

## 2020-11-27 NOTE — Patient Instructions (Signed)

## 2020-12-01 ENCOUNTER — Ambulatory Visit
Admission: RE | Admit: 2020-12-01 | Discharge: 2020-12-01 | Disposition: A | Discharge status: Home / Self Care | Payer: BC Managed Care – PPO | Source: Ambulatory Visit | Attending: Internal Medicine | Admitting: Internal Medicine

## 2020-12-01 DIAGNOSIS — E041 Nontoxic single thyroid nodule: Secondary | ICD-10-CM

## 2020-12-07 ENCOUNTER — Other Ambulatory Visit: Payer: Self-pay | Admitting: Neurology

## 2020-12-19 ENCOUNTER — Other Ambulatory Visit: Payer: Self-pay | Admitting: Neurology

## 2021-01-05 DIAGNOSIS — R053 Chronic cough: Secondary | ICD-10-CM | POA: Diagnosis not present

## 2021-01-05 DIAGNOSIS — Z01419 Encounter for gynecological examination (general) (routine) without abnormal findings: Secondary | ICD-10-CM | POA: Diagnosis not present

## 2021-01-05 DIAGNOSIS — Z6828 Body mass index (BMI) 28.0-28.9, adult: Secondary | ICD-10-CM | POA: Diagnosis not present

## 2021-01-05 DIAGNOSIS — Z1231 Encounter for screening mammogram for malignant neoplasm of breast: Secondary | ICD-10-CM | POA: Diagnosis not present

## 2021-02-26 DIAGNOSIS — R102 Pelvic and perineal pain: Secondary | ICD-10-CM | POA: Diagnosis not present

## 2021-03-30 DIAGNOSIS — J4521 Mild intermittent asthma with (acute) exacerbation: Secondary | ICD-10-CM | POA: Diagnosis not present

## 2021-03-30 DIAGNOSIS — J209 Acute bronchitis, unspecified: Secondary | ICD-10-CM | POA: Diagnosis not present

## 2021-04-08 DIAGNOSIS — J209 Acute bronchitis, unspecified: Secondary | ICD-10-CM | POA: Diagnosis not present

## 2021-04-08 DIAGNOSIS — J3089 Other allergic rhinitis: Secondary | ICD-10-CM | POA: Diagnosis not present

## 2021-04-08 DIAGNOSIS — J4540 Moderate persistent asthma, uncomplicated: Secondary | ICD-10-CM | POA: Diagnosis not present

## 2021-04-08 DIAGNOSIS — L2089 Other atopic dermatitis: Secondary | ICD-10-CM | POA: Diagnosis not present

## 2021-04-09 DIAGNOSIS — J4540 Moderate persistent asthma, uncomplicated: Secondary | ICD-10-CM | POA: Diagnosis not present

## 2021-04-27 DIAGNOSIS — F4322 Adjustment disorder with anxiety: Secondary | ICD-10-CM | POA: Diagnosis not present

## 2021-05-06 DIAGNOSIS — F4322 Adjustment disorder with anxiety: Secondary | ICD-10-CM | POA: Diagnosis not present

## 2021-05-11 DIAGNOSIS — J4540 Moderate persistent asthma, uncomplicated: Secondary | ICD-10-CM | POA: Diagnosis not present

## 2021-05-18 DIAGNOSIS — F4322 Adjustment disorder with anxiety: Secondary | ICD-10-CM | POA: Diagnosis not present

## 2021-05-25 DIAGNOSIS — F4322 Adjustment disorder with anxiety: Secondary | ICD-10-CM | POA: Diagnosis not present

## 2021-06-08 DIAGNOSIS — F4322 Adjustment disorder with anxiety: Secondary | ICD-10-CM | POA: Diagnosis not present

## 2021-06-09 DIAGNOSIS — E538 Deficiency of other specified B group vitamins: Secondary | ICD-10-CM | POA: Diagnosis not present

## 2021-06-09 DIAGNOSIS — Z13220 Encounter for screening for lipoid disorders: Secondary | ICD-10-CM | POA: Diagnosis not present

## 2021-06-09 DIAGNOSIS — Z1159 Encounter for screening for other viral diseases: Secondary | ICD-10-CM | POA: Diagnosis not present

## 2021-06-09 DIAGNOSIS — J4540 Moderate persistent asthma, uncomplicated: Secondary | ICD-10-CM | POA: Diagnosis not present

## 2021-06-09 DIAGNOSIS — M8580 Other specified disorders of bone density and structure, unspecified site: Secondary | ICD-10-CM | POA: Diagnosis not present

## 2021-06-09 DIAGNOSIS — F419 Anxiety disorder, unspecified: Secondary | ICD-10-CM | POA: Diagnosis not present

## 2021-06-09 DIAGNOSIS — E559 Vitamin D deficiency, unspecified: Secondary | ICD-10-CM | POA: Diagnosis not present

## 2021-06-09 DIAGNOSIS — Z23 Encounter for immunization: Secondary | ICD-10-CM | POA: Diagnosis not present

## 2021-06-09 DIAGNOSIS — Z0000 Encounter for general adult medical examination without abnormal findings: Secondary | ICD-10-CM | POA: Diagnosis not present

## 2021-06-16 DIAGNOSIS — J4550 Severe persistent asthma, uncomplicated: Secondary | ICD-10-CM | POA: Diagnosis not present

## 2021-06-16 DIAGNOSIS — J4540 Moderate persistent asthma, uncomplicated: Secondary | ICD-10-CM | POA: Diagnosis not present

## 2021-06-18 DIAGNOSIS — F4322 Adjustment disorder with anxiety: Secondary | ICD-10-CM | POA: Diagnosis not present

## 2021-06-19 DIAGNOSIS — L570 Actinic keratosis: Secondary | ICD-10-CM | POA: Diagnosis not present

## 2021-06-19 DIAGNOSIS — L821 Other seborrheic keratosis: Secondary | ICD-10-CM | POA: Diagnosis not present

## 2021-06-22 DIAGNOSIS — F4322 Adjustment disorder with anxiety: Secondary | ICD-10-CM | POA: Diagnosis not present

## 2021-06-28 DIAGNOSIS — J018 Other acute sinusitis: Secondary | ICD-10-CM | POA: Diagnosis not present

## 2021-06-28 DIAGNOSIS — J189 Pneumonia, unspecified organism: Secondary | ICD-10-CM | POA: Diagnosis not present

## 2021-06-29 DIAGNOSIS — F4322 Adjustment disorder with anxiety: Secondary | ICD-10-CM | POA: Diagnosis not present

## 2021-07-04 DIAGNOSIS — R110 Nausea: Secondary | ICD-10-CM | POA: Diagnosis not present

## 2021-07-04 DIAGNOSIS — T7840XA Allergy, unspecified, initial encounter: Secondary | ICD-10-CM | POA: Diagnosis not present

## 2021-07-04 DIAGNOSIS — I10 Essential (primary) hypertension: Secondary | ICD-10-CM | POA: Diagnosis not present

## 2021-07-04 DIAGNOSIS — R112 Nausea with vomiting, unspecified: Secondary | ICD-10-CM | POA: Diagnosis not present

## 2021-07-06 DIAGNOSIS — J383 Other diseases of vocal cords: Secondary | ICD-10-CM | POA: Diagnosis not present

## 2021-07-06 DIAGNOSIS — J3081 Allergic rhinitis due to animal (cat) (dog) hair and dander: Secondary | ICD-10-CM | POA: Diagnosis not present

## 2021-07-06 DIAGNOSIS — J4550 Severe persistent asthma, uncomplicated: Secondary | ICD-10-CM | POA: Diagnosis not present

## 2021-07-06 DIAGNOSIS — J3089 Other allergic rhinitis: Secondary | ICD-10-CM | POA: Diagnosis not present

## 2021-07-06 DIAGNOSIS — F4322 Adjustment disorder with anxiety: Secondary | ICD-10-CM | POA: Diagnosis not present

## 2021-07-06 DIAGNOSIS — L2089 Other atopic dermatitis: Secondary | ICD-10-CM | POA: Diagnosis not present

## 2021-07-09 DIAGNOSIS — J4550 Severe persistent asthma, uncomplicated: Secondary | ICD-10-CM | POA: Diagnosis not present

## 2021-07-13 DIAGNOSIS — F4322 Adjustment disorder with anxiety: Secondary | ICD-10-CM | POA: Diagnosis not present

## 2021-07-14 DIAGNOSIS — J4540 Moderate persistent asthma, uncomplicated: Secondary | ICD-10-CM | POA: Diagnosis not present

## 2021-07-14 DIAGNOSIS — J4550 Severe persistent asthma, uncomplicated: Secondary | ICD-10-CM | POA: Diagnosis not present

## 2021-07-17 DIAGNOSIS — J383 Other diseases of vocal cords: Secondary | ICD-10-CM | POA: Diagnosis not present

## 2021-07-17 DIAGNOSIS — J029 Acute pharyngitis, unspecified: Secondary | ICD-10-CM | POA: Diagnosis not present

## 2021-07-30 DIAGNOSIS — F4322 Adjustment disorder with anxiety: Secondary | ICD-10-CM | POA: Diagnosis not present

## 2021-08-03 NOTE — Progress Notes (Signed)
NEUROLOGY FOLLOW UP OFFICE NOTE  Dillie Burandt [id number]  Assessment/Plan:   1.Recurrent episodic dizziness - consider vestibular migraine- overall stable 2. Bilateral hand clumsiness/tremor - usually with pain - may be related to arthritis   Refill gabapentin '600mg'$  at bedtime Follow up one year   Subjective:  Kasandra Fehr is a 61 year old right-handed woman with history of hypothyroidism and asthma who follows up for vestibular migraine, stabbing headache and thigh pain.   UPDATE: She is taking gabapentin '600mg'$  at bedtime.    May wake up with dizziness in the mornings.  Will wait in bed for a little bit and will resolve.  Headache/otalgia is very infrequent.  May take Tylenol.    No episodes of clumsiness and tremor.       Depression/anxiety:  No Sleep hygiene:  Variable   HISTORY: In November 2014, she started experiencing episodes of headache associated with slowed processing, clumsiness of her right hand, difficulty getting words out and unsteady gait, possibly veering towards the right.  The headaches were described as bi-temporal aching or stabbing pain, anywhere from 3 to 7/10.  For a period of 6 weeks, she was getting them almost daily.  She reportedly had an MRI of the brain performed 02/13/13, which was unremarkable.  She had an MRI of the brain without contrast performed on 02/28/13, which was normal.  Carotid duplex performed 03/28/13 revealed no hemodynamically significant stenosis.  MRA of the head was performed on 03/29/13, which revealed focal area of decreased signal in the left ICA, thought to be artifact.  Blood work was performed on 03/16/13, which showed ANA negative, Sed Rate 3, and ACE 47.  Symptoms had resolved and migraine was suspected. She was advised to start ASA '81mg'$  daily.  Starting in September 2015, she began experiencing similar episodes.  At that time, she woke up one morning with headache, spinning sensation and nausea.  She fell back to sleep and when she  woke up 2 hours later, it had resolved.  She later developed another episode of dizziness, associated with headache. She also reported slight clumsiness of the right hand.  During an episode, it is more difficult to write.  It lasted 5 days.  In 2021, she endorsed recurrence of her habitual symptoms: feeling off balance, difficulty grasping objects, frequently dropping objects, mild headache and dizziness.  In the morning endorsed numbness in hands when she woke up.  MRI of brain with and without contrast on 08/22/2019 was normal. Considering that the hand weakness and numbness may indicated carpal tunnel syndrome, she had NCV-EMG of the upper extremities on 09/18/2019 showed mild right ulnar neuropathy at the elbow but no evidence of CTS or cervical radiculopathy.  MRI of cervical spine on 10/11/2019 showed cervical spondylosis with mild spinal stenosis at C4-5 through C6-7 and neuroforaminal stenosis at left C5-6.  Symptoms did resolve.   She was evaluated for dizziness by Dr. Constance Holster, ENT.  She was experiencing vertigo and imbalance with aural fullness and possible hearing loss.  Exam was normal.      Personal history of headache:  No but when she was 14 or 15, she was experiencing similar episodes of dizziness. Family history of headache:  no  PAST MEDICAL HISTORY: Past Medical History:  Diagnosis Date   Asthma    GERD (gastroesophageal reflux disease)    Gout    Headache(784.0) 03/15/2013   Hypothyroid    IBS (irritable bowel syndrome)     MEDICATIONS: Current Outpatient Medications on File  Prior to Visit  Medication Sig Dispense Refill   acetaminophen (TYLENOL) 325 MG tablet Take 2 tablets (650 mg total) by mouth every 6 (six) hours as needed for mild pain or moderate pain. 30 tablet 1   ADVAIR HFA 230-21 MCG/ACT inhaler      benzonatate (TESSALON) 100 MG capsule Take 1-2 capsules (100-200 mg total) by mouth 3 (three) times daily as needed for cough. 40 capsule 0   Calcium 200 MG TABS Take by  mouth.     Cholecalciferol (VITAMIN D3) 50 MCG (2000 UT) TABS Take by mouth.     EPIPEN 2-PAK 0.3 MG/0.3ML SOAJ injection See admin instructions.  1   escitalopram (LEXAPRO) 10 MG tablet Take 10 mg by mouth daily.  2   fexofenadine-pseudoephedrine (ALLEGRA-D 24) 180-240 MG 24 hr tablet Take 1 tablet by mouth daily.     Fluticasone Furoate (FLONASE SENSIMIST NA) Place into the nose.     gabapentin (NEURONTIN) 300 MG capsule TAKE 2 CAPSULES BY MOUTH AT BEDTIME. 540 capsule 1   Lactobacillus Rhamnosus, GG, (CULTURELLE) CAPS Take by mouth.     levothyroxine (SYNTHROID) 75 MCG tablet 1 TABLET ON AN EMPTY STOMACH IN THE MORNING EVERY OTHER DAY, ALTERNATING WITH 88MCG ORALLY 90 DAYS DX code E06.3 45 tablet 3   levothyroxine (SYNTHROID) 88 MCG tablet 1 TABLET ON AN EMPTY STOMACH IN THE MORNING EVERY OTHER DAY ALTERNATING WITH 75 MCG ORALLY 90 DAYS Dx code E06.3 45 tablet 3   LORazepam (ATIVAN) 0.5 MG tablet Take 1 tablet (0.5 mg total) by mouth 2 (two) times daily as needed for anxiety (usually only uses for flight). 15 tablet 1   meclizine (ANTIVERT) 25 MG tablet Take 1 tablet (25 mg total) by mouth 3 (three) times daily as needed for dizziness. 30 tablet 1   Omega-3 Fatty Acids (FISH OIL ADULT GUMMIES PO) Take by mouth.     Probiotic Product (PROBIOTIC ADVANCED PO) Take by mouth daily.     Turmeric (QC TUMERIC COMPLEX PO) Take by mouth.     vitamin B-12 (CYANOCOBALAMIN) 250 MCG tablet Take 250 mcg by mouth daily.     Vitamins-Lipotropics (B-100) TABS Take by mouth.     XOPENEX HFA 45 MCG/ACT inhaler Inhale 2 puffs into the lungs as needed (SOB).      No current facility-administered medications on file prior to visit.    ALLERGIES: Allergies  Allergen Reactions   Shellfish Allergy Nausea And Vomiting   Penicillins Hives   Sulfa Antibiotics Hives    FAMILY HISTORY: Family History  Problem Relation Age of Onset   Heart failure Mother    Heart disease Father 48   Melanoma Father    Heart  attack Brother 54   Heart disease Maternal Grandmother    Cancer Maternal Grandfather        brain cancer   Stroke Paternal Grandfather    Cancer Paternal Grandmother        Breast cancer       Objective:  Blood pressure 120/69, pulse 73, height '5\' 2"'$  (1.575 m), weight 162 lb 6.4 oz (73.7 kg), SpO2 99 %. General: No acute distress.  Patient appears well-groomed.   Head:  Normocephalic/atraumatic Eyes:  Fundi examined but not visualized Neck: supple, no paraspinal tenderness, full range of motion Heart:  Regular rate and rhythm Neurological Exam: alert and oriented to person, place, and time.  Speech fluent and not dysarthric, language intact.  CN II-XII intact. Bulk and tone normal, muscle strength 5/5 throughout.  Sensation to light touch intact.  Deep tendon reflexes 2+ throughout.  Finger to nose testing intact.  Gait normal, Romberg negative.   Metta Clines, DO  CC: Caren Macadam, MD

## 2021-08-04 ENCOUNTER — Encounter: Payer: Self-pay | Admitting: Neurology

## 2021-08-04 ENCOUNTER — Ambulatory Visit (INDEPENDENT_AMBULATORY_CARE_PROVIDER_SITE_OTHER): Payer: BC Managed Care – PPO | Admitting: Neurology

## 2021-08-04 VITALS — BP 120/69 | HR 73 | Ht 62.0 in | Wt 162.4 lb

## 2021-08-04 DIAGNOSIS — G43809 Other migraine, not intractable, without status migrainosus: Secondary | ICD-10-CM | POA: Diagnosis not present

## 2021-08-04 MED ORDER — GABAPENTIN 300 MG PO CAPS: 600.0000 mg | ORAL_CAPSULE | Freq: Every day | ORAL | 3 refills | Status: AC

## 2021-08-04 NOTE — Patient Instructions (Signed)
Gabapentin '600mg'$  at bedtime Follow up one year

## 2021-08-06 DIAGNOSIS — J4550 Severe persistent asthma, uncomplicated: Secondary | ICD-10-CM | POA: Diagnosis not present

## 2021-08-13 DIAGNOSIS — F4322 Adjustment disorder with anxiety: Secondary | ICD-10-CM | POA: Diagnosis not present

## 2021-08-18 DIAGNOSIS — J4540 Moderate persistent asthma, uncomplicated: Secondary | ICD-10-CM | POA: Diagnosis not present

## 2021-08-18 DIAGNOSIS — J4550 Severe persistent asthma, uncomplicated: Secondary | ICD-10-CM | POA: Diagnosis not present

## 2021-08-27 DIAGNOSIS — Z713 Dietary counseling and surveillance: Secondary | ICD-10-CM | POA: Diagnosis not present

## 2021-09-09 ENCOUNTER — Ambulatory Visit (INDEPENDENT_AMBULATORY_CARE_PROVIDER_SITE_OTHER): Payer: Self-pay

## 2021-09-09 DIAGNOSIS — J4540 Moderate persistent asthma, uncomplicated: Secondary | ICD-10-CM | POA: Diagnosis not present

## 2021-09-14 DIAGNOSIS — J4540 Moderate persistent asthma, uncomplicated: Secondary | ICD-10-CM | POA: Diagnosis not present

## 2021-09-14 DIAGNOSIS — F4322 Adjustment disorder with anxiety: Secondary | ICD-10-CM | POA: Diagnosis not present

## 2021-09-14 DIAGNOSIS — J4550 Severe persistent asthma, uncomplicated: Secondary | ICD-10-CM | POA: Diagnosis not present

## 2021-09-22 DIAGNOSIS — Z713 Dietary counseling and surveillance: Secondary | ICD-10-CM | POA: Diagnosis not present

## 2021-10-01 DIAGNOSIS — F4322 Adjustment disorder with anxiety: Secondary | ICD-10-CM | POA: Diagnosis not present

## 2021-10-08 DIAGNOSIS — L578 Other skin changes due to chronic exposure to nonionizing radiation: Secondary | ICD-10-CM | POA: Diagnosis not present

## 2021-10-08 DIAGNOSIS — L570 Actinic keratosis: Secondary | ICD-10-CM | POA: Diagnosis not present

## 2021-10-08 DIAGNOSIS — L821 Other seborrheic keratosis: Secondary | ICD-10-CM | POA: Diagnosis not present

## 2021-10-08 DIAGNOSIS — D485 Neoplasm of uncertain behavior of skin: Secondary | ICD-10-CM | POA: Diagnosis not present

## 2021-10-08 DIAGNOSIS — D225 Melanocytic nevi of trunk: Secondary | ICD-10-CM | POA: Diagnosis not present

## 2021-10-08 DIAGNOSIS — D0471 Carcinoma in situ of skin of right lower limb, including hip: Secondary | ICD-10-CM | POA: Diagnosis not present

## 2021-10-09 DIAGNOSIS — J4540 Moderate persistent asthma, uncomplicated: Secondary | ICD-10-CM | POA: Diagnosis not present

## 2021-10-09 DIAGNOSIS — J4550 Severe persistent asthma, uncomplicated: Secondary | ICD-10-CM | POA: Diagnosis not present

## 2021-10-13 DIAGNOSIS — J4540 Moderate persistent asthma, uncomplicated: Secondary | ICD-10-CM | POA: Diagnosis not present

## 2021-10-13 DIAGNOSIS — J4550 Severe persistent asthma, uncomplicated: Secondary | ICD-10-CM | POA: Diagnosis not present

## 2021-10-15 DIAGNOSIS — Z713 Dietary counseling and surveillance: Secondary | ICD-10-CM | POA: Diagnosis not present

## 2021-10-19 DIAGNOSIS — F4322 Adjustment disorder with anxiety: Secondary | ICD-10-CM | POA: Diagnosis not present

## 2021-10-22 DIAGNOSIS — M79605 Pain in left leg: Secondary | ICD-10-CM | POA: Diagnosis not present

## 2021-10-27 DIAGNOSIS — C44722 Squamous cell carcinoma of skin of right lower limb, including hip: Secondary | ICD-10-CM | POA: Diagnosis not present

## 2021-10-27 DIAGNOSIS — D485 Neoplasm of uncertain behavior of skin: Secondary | ICD-10-CM | POA: Diagnosis not present

## 2021-10-28 DIAGNOSIS — F4322 Adjustment disorder with anxiety: Secondary | ICD-10-CM | POA: Diagnosis not present

## 2021-11-06 DIAGNOSIS — J4540 Moderate persistent asthma, uncomplicated: Secondary | ICD-10-CM | POA: Diagnosis not present

## 2021-11-11 DIAGNOSIS — J4550 Severe persistent asthma, uncomplicated: Secondary | ICD-10-CM | POA: Diagnosis not present

## 2021-11-11 DIAGNOSIS — J4540 Moderate persistent asthma, uncomplicated: Secondary | ICD-10-CM | POA: Diagnosis not present

## 2021-11-12 DIAGNOSIS — Z713 Dietary counseling and surveillance: Secondary | ICD-10-CM | POA: Diagnosis not present

## 2021-11-25 DIAGNOSIS — F4322 Adjustment disorder with anxiety: Secondary | ICD-10-CM | POA: Diagnosis not present

## 2021-11-27 ENCOUNTER — Encounter: Payer: Self-pay | Admitting: Internal Medicine

## 2021-11-27 ENCOUNTER — Ambulatory Visit (INDEPENDENT_AMBULATORY_CARE_PROVIDER_SITE_OTHER): Payer: BC Managed Care – PPO | Admitting: Internal Medicine

## 2021-11-27 VITALS — BP 110/70 | HR 60 | Ht 62.0 in | Wt 159.4 lb

## 2021-11-27 DIAGNOSIS — E041 Nontoxic single thyroid nodule: Secondary | ICD-10-CM | POA: Diagnosis not present

## 2021-11-27 DIAGNOSIS — E063 Autoimmune thyroiditis: Secondary | ICD-10-CM | POA: Diagnosis not present

## 2021-11-27 DIAGNOSIS — Z23 Encounter for immunization: Secondary | ICD-10-CM

## 2021-11-27 LAB — T4, FREE: Free T4: 1.11 ng/dL (ref 0.60–1.60)

## 2021-11-27 LAB — TSH: TSH: 2.42 u[IU]/mL (ref 0.35–5.50)

## 2021-11-27 MED ORDER — LEVOTHYROXINE SODIUM 75 MCG PO TABS: ORAL_TABLET | ORAL | 3 refills | Status: AC

## 2021-11-27 MED ORDER — LEVOTHYROXINE SODIUM 88 MCG PO TABS: ORAL_TABLET | ORAL | 3 refills | Status: AC

## 2021-11-27 NOTE — Progress Notes (Signed)
Name: Katie Oneill  MRN/ DOB: [id number], 08/25/1960    Age/ Sex: 61 y.o., female     PCP: Elwin Mocha, MD   Reason for Endocrinology Evaluation: Hypothyroidism     Initial Endocrinology Clinic Visit: 11/13/2018    PATIENT IDENTIFIER: [name] is a 61 y.o., female with a past medical history of Hypothyroidism and allergic rhinitis. She has followed with [facility] since 11/13/2018 for consultative assistance with management of her hypothyroidism   HISTORICAL SUMMARY:  She has been diagnosed with hashimoto's thyroiditis  Many years ago and has been on LT-4 replacement for years.      An ultrasound was performed in 2018 which demonstrated a right inferior 1.4 cm nodule with benign cytology. Repeat imaging in 2019 confirmed stability.    No prior radiation exposure Niece was diagnosed with  thyroid cancer and pt requested a referral to an endocrinologist.   SUBJECTIVE:    Today (11/27/2021):  [name] is here for hypothyroidism and thyroid nodule.   Weight has been trending down  Denies local neck symptoms  Denies dysphagia  Has constipation  Denies palpitations  Has occasional tremors     Levothyroxine  88 mcg Sunday, Tuesday, Thursday and Saturday  Levothyroxine 75 mcg rest of the week   HISTORY:  Past Medical History:  Past Medical History:  Diagnosis Date   Asthma    GERD (gastroesophageal reflux disease)    Gout    Headache(784.0) 03/15/2013   Hypothyroid    IBS (irritable bowel syndrome)    Past Surgical History:  Past Surgical History:  Procedure Laterality Date   ABDOMINAL HYSTERECTOMY     BUNIONECTOMY Left    CHOLECYSTECTOMY N/A 06/20/2013   Procedure: LAPAROSCOPIC CHOLECYSTECTOMY WITH INTRAOPERATIVE CHOLANGIOGRAM;  Surgeon: Earnstine Regal, MD;  Location: [facility];  Service: General;  Laterality: N/A;   ERCP N/A 06/19/2013   Procedure: ENDOSCOPIC RETROGRADE CHOLANGIOPANCREATOGRAPHY (ERCP);  Surgeon: Beryle Beams, MD;   Location: [facility];  Service: Endoscopy;  Laterality: N/A;   NASAL SINUS SURGERY     x2   STRABISMUS SURGERY     TENDON REPAIR Right    hand   TONSILLECTOMY  1967   Social History:  reports that she has never smoked. She has never used smokeless tobacco. She reports current alcohol use of about 1.0 standard drink of alcohol per week. She reports that she does not use drugs. Family History:  Family History  Problem Relation Age of Onset   Heart failure Mother    Heart disease Father 58   Melanoma Father    Heart attack Brother 54   Heart disease Maternal Grandmother    Cancer Maternal Grandfather        brain cancer   Stroke Paternal Grandfather    Cancer Paternal Grandmother        Breast cancer      HOME MEDICATIONS: Allergies as of 11/27/2021       Reactions   Shellfish Allergy Nausea And Vomiting   Penicillins Hives   Sulfa Antibiotics Hives        Medication List        Accurate as of November 27, 2021  7:50 AM. If you have any questions, ask your nurse or doctor.          acetaminophen 325 MG tablet Commonly known as: TYLENOL Take 2 tablets (650 mg total) by mouth every 6 (six) hours as needed for mild pain or moderate pain.   Advair HFA  230-21 MCG/ACT inhaler Generic drug: fluticasone-salmeterol   Calcium 200 MG Tabs Take by mouth.   Culturelle Caps Take by mouth.   EpiPen 2-Pak 0.3 mg/0.3 mL Soaj injection Generic drug: EPINEPHrine See admin instructions.   escitalopram 10 MG tablet Commonly known as: LEXAPRO Take 10 mg by mouth daily.   fexofenadine-pseudoephedrine 180-240 MG 24 hr tablet Commonly known as: ALLEGRA-D 24 Take 1 tablet by mouth daily.   FISH OIL ADULT GUMMIES PO Take by mouth.   FLONASE SENSIMIST NA Place into the nose.   gabapentin 300 MG capsule Commonly known as: NEURONTIN Take 2 capsules (600 mg total) by mouth at bedtime.   levothyroxine 75 MCG tablet Commonly known as: SYNTHROID 1 TABLET ON AN EMPTY  STOMACH IN THE MORNING EVERY OTHER DAY, ALTERNATING WITH 88MCG ORALLY 90 DAYS DX code E06.3   levothyroxine 88 MCG tablet Commonly known as: SYNTHROID 1 TABLET ON AN EMPTY STOMACH IN THE MORNING EVERY OTHER DAY ALTERNATING WITH 75 MCG ORALLY 90 DAYS Dx code E06.3   LORazepam 0.5 MG tablet Commonly known as: ATIVAN Take 1 tablet (0.5 mg total) by mouth 2 (two) times daily as needed for anxiety (usually only uses for flight).   meclizine 25 MG tablet Commonly known as: ANTIVERT Take 1 tablet (25 mg total) by mouth 3 (three) times daily as needed for dizziness.   PROBIOTIC ADVANCED PO Take by mouth daily.   QC TUMERIC COMPLEX PO Take by mouth.   TEZSPIRE [place]   vitamin B-12 250 MCG tablet Commonly known as: CYANOCOBALAMIN Take 250 mcg by mouth daily.   Vitamin D3 50 MCG (2000 UT) Tabs Take by mouth.   Xopenex HFA 45 MCG/ACT inhaler Generic drug: levalbuterol Inhale 2 puffs into the lungs as needed (SOB).          OBJECTIVE:   PHYSICAL EXAM: VS: BP 110/70 (BP Location: Left Arm, Patient Position: Sitting, Cuff Size: Small)   Pulse 60   Ht '5\' 2"'$  (1.575 m)   Wt 159 lb 6.4 oz (72.3 kg)   SpO2 95%   BMI 29.15 kg/m    EXAM: General: Pt appears well and is in NAD  Neck: General: Supple without adenopathy. Thyroid: Thyroid size normal.  No goiter or nodules appreciated.  Lungs: Clear with good BS bilat with no rales, rhonchi, or wheezes  Heart: Auscultation: RRR.  Abdomen: Normoactive bowel sounds, soft, nontender, without masses or organomegaly palpable  Extremities:  BL LE: No pretibial edema normal ROM and strength.  Mental Status: Judgment, insight: Intact Orientation: Oriented to time, place, and person Mood and affect: No depression, anxiety, or agitation     DATA REVIEWED:  Latest Reference Range & Units 11/27/21 07:54  TSH 0.35 - 5.50 uIU/mL 2.42  T4,Free(Direct) 0.60 - 1.60 ng/dL 1.11     Thyroid Ultrasound 12/02/2020  Estimated total number of  nodules >/= 1 cm: 1   Number of spongiform nodules >/=  2 cm not described below (TR1): 0   Number of mixed cystic and solid nodules >/= 1.5 cm not described below ([place]): 0   _________________________________________________________   Previously biopsied 1.2 x 0.7 x 0.6 cm right inferior thyroid nodule is not significantly changed in size since prior exam. Please correlate with prior FNA results.   IMPRESSION: Previously biopsied right inferior thyroid nodule is not significantly changed in size since prior exam.      FNA 12/01/2016 THYROID, FINE NEEDLE ASPIRATION, RLP (SPECIMEN 1 OF 1, COLLECTED 12/01/16): CONSISTENT WITH LYMPHOCYTIC (HASHIMOTO) THYROIDITIS IN  THE PROPER CLINICAL CONTEXT (BETHESDA CATEGORY II).    ASSESSMENT / PLAN / RECOMMENDATIONS:   Hashimoto's Thyroiditis :    - Clinically and biochemically euthyroid  -She is compliant with LT-4 replacement -We did discuss my preference to keep her TSH > 1 .0 u IU/mL due to the risk of iatrogenic hyperthyroid, we discussed the risk of cardiac arrhythmia as well as increased bone resorption with hyperthyroid -TSH is within goal, no changes     Medications :  Levothyroxine  88 mcg Sunday, Tuesday, Thursday and Saturday  Levothyroxine 75 mcg rest of the week     2. Right thyroid Nodule :    - No local neck symptoms  - S/P Benign FNA in 2018 -We will proceed with repeating thyroid ultrasound, if this remains stable no further ultrasound will be needed as this would have accomplished 5-year stability  F/U in 1 yr     Signed electronically by: Mack Guise, MD  [facility]  [address] [place], [place] [address] Phone: [phone] FAX: [phone]      CC: Elwin Mocha, [place] [address] Phone: [phone]  Fax: [phone]   Return to Endocrinology clinic as below: Future Appointments  Date Time  Provider [place]  08/05/2022  8:50 AM Pieter Partridge, DO [facility] None

## 2021-11-27 NOTE — Patient Instructions (Signed)

## 2021-12-01 ENCOUNTER — Other Ambulatory Visit: Payer: BC Managed Care – PPO

## 2021-12-02 ENCOUNTER — Other Ambulatory Visit: Payer: BC Managed Care – PPO

## 2021-12-03 ENCOUNTER — Other Ambulatory Visit: Payer: BC Managed Care – PPO

## 2021-12-03 DIAGNOSIS — J4550 Severe persistent asthma, uncomplicated: Secondary | ICD-10-CM | POA: Diagnosis not present

## 2021-12-07 DIAGNOSIS — E538 Deficiency of other specified B group vitamins: Secondary | ICD-10-CM | POA: Diagnosis not present

## 2021-12-08 ENCOUNTER — Ambulatory Visit
Admission: RE | Admit: 2021-12-08 | Discharge: 2021-12-08 | Disposition: A | Discharge status: Home / Self Care | Payer: BC Managed Care – PPO | Source: Ambulatory Visit | Attending: Internal Medicine | Admitting: Internal Medicine

## 2021-12-08 DIAGNOSIS — E041 Nontoxic single thyroid nodule: Secondary | ICD-10-CM | POA: Diagnosis not present

## 2021-12-08 DIAGNOSIS — Z713 Dietary counseling and surveillance: Secondary | ICD-10-CM | POA: Diagnosis not present

## 2021-12-09 ENCOUNTER — Encounter: Payer: Self-pay | Admitting: Internal Medicine

## 2021-12-14 DIAGNOSIS — F4322 Adjustment disorder with anxiety: Secondary | ICD-10-CM | POA: Diagnosis not present

## 2021-12-16 DIAGNOSIS — J4550 Severe persistent asthma, uncomplicated: Secondary | ICD-10-CM | POA: Diagnosis not present

## 2021-12-16 DIAGNOSIS — J4540 Moderate persistent asthma, uncomplicated: Secondary | ICD-10-CM | POA: Diagnosis not present

## 2021-12-22 ENCOUNTER — Telehealth: Payer: Self-pay | Admitting: Neurology

## 2021-12-22 NOTE — Telephone Encounter (Signed)
Patient called after hours and left a message requesting to come in to come to see Dr. Tomi Likens about her migraines before her next appointment in June of next year.  Okay for scheduling?

## 2021-12-22 NOTE — Progress Notes (Unsigned)
NEUROLOGY FOLLOW UP OFFICE NOTE  Katie Oneill [id number]  Assessment/Plan:   Vestibular migraine 2.   Left ear fluttering - likely middle ear myoclonus.  I don't suspect it is a symptom of the migraine   When she has another vertigo attack or headache, will have her try sumatriptan.  Will also have her try samples of Nurtec as well. Continue gabapentin '600mg'$  at bedtime If ear fluttering doesn't improve or gets worse, recommend seeing ENT Follow up in June as scheduled.   Subjective:  Katie Oneill is a 61 year old right-handed woman with history of hypothyroidism and asthma who follows up for vestibular migraine, stabbing headache and thigh pain.   UPDATE: She is taking gabapentin '600mg'$  at bedtime, meclizine.    Over the past month, she has been feeling fluttering in the left ear off and on.  No otalgia, aural fullness, hearing loss or ringing.  She did have COVID about a month prior.  Then on Sunday night, it was occurring longer than previously Then Monday she had a severe migraine with vertigo that resolved only after she laid down and later a severe stabbing headache within an hour.  This was the first migraine since she last saw me       HISTORY: In November 2014, she started experiencing episodes of headache associated with slowed processing, clumsiness of her right hand, difficulty getting words out and unsteady gait, possibly veering towards the right.  The headaches were described as bi-temporal aching or stabbing pain, anywhere from 3 to 7/10.  For a period of 6 weeks, she was getting them almost daily.  She reportedly had an MRI of the brain performed 02/13/13, which was unremarkable.  She had an MRI of the brain without contrast performed on 02/28/13, which was normal.  Carotid duplex performed 03/28/13 revealed no hemodynamically significant stenosis.  MRA of the head was performed on 03/29/13, which revealed focal area of decreased signal in the left ICA, thought to be artifact.   Blood work was performed on 03/16/13, which showed ANA negative, Sed Rate 3, and ACE 47.  Symptoms had resolved and migraine was suspected. She was advised to start ASA '81mg'$  daily.  Starting in September 2015, she began experiencing similar episodes.  At that time, she woke up one morning with headache, spinning sensation and nausea.  She fell back to sleep and when she woke up 2 hours later, it had resolved.  She later developed another episode of dizziness, associated with headache. She also reported slight clumsiness of the right hand.  During an episode, it is more difficult to write.  It lasted 5 days.  In 2021, she endorsed recurrence of her habitual symptoms: feeling off balance, difficulty grasping objects, frequently dropping objects, mild headache and dizziness.  In the morning endorsed numbness in hands when she woke up.  MRI of brain with and without contrast on 08/22/2019 was normal. Considering that the hand weakness and numbness may indicated carpal tunnel syndrome, she had NCV-EMG of the upper extremities on 09/18/2019 showed mild right ulnar neuropathy at the elbow but no evidence of CTS or cervical radiculopathy.  MRI of cervical spine on 10/11/2019 showed cervical spondylosis with mild spinal stenosis at C4-5 through C6-7 and neuroforaminal stenosis at left C5-6.  Symptoms did resolve.   She was evaluated for dizziness by Dr. Constance Holster, ENT.  She was experiencing vertigo and imbalance with aural fullness and possible hearing loss.  Exam was normal.      Personal history  of headache:  No but when she was 14 or 15, she was experiencing similar episodes of dizziness. Family history of headache:  no  Past medications:  atenolol, rizatriptan  PAST MEDICAL HISTORY: Past Medical History:  Diagnosis Date   Asthma    GERD (gastroesophageal reflux disease)    Gout    Headache(784.0) 03/15/2013   Hypothyroid    IBS (irritable bowel syndrome)     MEDICATIONS: Current Outpatient Medications on  File Prior to Visit  Medication Sig Dispense Refill   acetaminophen (TYLENOL) 325 MG tablet Take 2 tablets (650 mg total) by mouth every 6 (six) hours as needed for mild pain or moderate pain. 30 tablet 1   ADVAIR HFA 230-21 MCG/ACT inhaler      Calcium 200 MG TABS Take by mouth.     Cholecalciferol (VITAMIN D3) 50 MCG (2000 UT) TABS Take by mouth.     EPIPEN 2-PAK 0.3 MG/0.3ML SOAJ injection See admin instructions.  1   escitalopram (LEXAPRO) 10 MG tablet Take 10 mg by mouth daily.  2   fexofenadine-pseudoephedrine (ALLEGRA-D 24) 180-240 MG 24 hr tablet Take 1 tablet by mouth daily.     Fluticasone Furoate (FLONASE SENSIMIST NA) Place into the nose.     gabapentin (NEURONTIN) 300 MG capsule Take 2 capsules (600 mg total) by mouth at bedtime. 180 capsule 3   Lactobacillus Rhamnosus, GG, (CULTURELLE) CAPS Take by mouth.     levothyroxine (SYNTHROID) 75 MCG tablet 1 TABLET ON AN EMPTY STOMACH IN THE MORNING EVERY OTHER DAY, ALTERNATING WITH 88MCG ORALLY 90 DAYS DX code E06.3 45 tablet 3   levothyroxine (SYNTHROID) 88 MCG tablet 1 TABLET ON AN EMPTY STOMACH IN THE MORNING EVERY OTHER DAY ALTERNATING WITH 75 MCG ORALLY 90 DAYS Dx code E06.3 45 tablet 3   LORazepam (ATIVAN) 0.5 MG tablet Take 1 tablet (0.5 mg total) by mouth 2 (two) times daily as needed for anxiety (usually only uses for flight). 15 tablet 1   meclizine (ANTIVERT) 25 MG tablet Take 1 tablet (25 mg total) by mouth 3 (three) times daily as needed for dizziness. 30 tablet 1   Omega-3 Fatty Acids (FISH OIL ADULT GUMMIES PO) Take by mouth.     Probiotic Product (PROBIOTIC ADVANCED PO) Take by mouth daily.     Tezepelumab-ekko (TEZSPIRE [place])      Turmeric (QC TUMERIC COMPLEX PO) Take by mouth.     vitamin B-12 (CYANOCOBALAMIN) 250 MCG tablet Take 250 mcg by mouth daily.     XOPENEX HFA 45 MCG/ACT inhaler Inhale 2 puffs into the lungs as needed (SOB).      No current facility-administered medications on file prior to visit.     ALLERGIES: Allergies  Allergen Reactions   Shellfish Allergy Nausea And Vomiting   Penicillins Hives   Sulfa Antibiotics Hives    FAMILY HISTORY: Family History  Problem Relation Age of Onset   Heart failure Mother    Heart disease Father 78   Melanoma Father    Heart attack Brother 75   Heart disease Maternal Grandmother    Cancer Maternal Grandfather        brain cancer   Stroke Paternal Grandfather    Cancer Paternal Grandmother        Breast cancer       Objective:  Blood pressure (!) 123/54, pulse 67, height '5\' 2"'$  (1.575 m), weight 164 lb (74.4 kg), SpO2 97 %. General: No acute distress.  Patient appears well-groomed.   Head:  Normocephalic/atraumatic Eyes:  Fundi examined but not visualized Neck: supple, no paraspinal tenderness, full range of motion Heart:  Regular rate and rhythm Neurological Exam: alert and oriented to person, place, and time.  Speech fluent and not dysarthric, language intact.  CN II-XII intact. Bulk and tone normal, muscle strength 5/5 throughout.  Sensation to light touch intact.  Deep tendon reflexes 2+ throughout, toes downgoing.  Finger to nose testing intact.  Gait normal, Romberg negative.   Metta Clines, DO  CC: Caren Macadam, MD

## 2021-12-23 ENCOUNTER — Encounter: Payer: Self-pay | Admitting: Neurology

## 2021-12-23 ENCOUNTER — Ambulatory Visit (INDEPENDENT_AMBULATORY_CARE_PROVIDER_SITE_OTHER): Payer: BC Managed Care – PPO | Admitting: Neurology

## 2021-12-23 VITALS — BP 123/54 | HR 67 | Ht 62.0 in | Wt 164.0 lb

## 2021-12-23 DIAGNOSIS — G43809 Other migraine, not intractable, without status migrainosus: Secondary | ICD-10-CM

## 2021-12-23 MED ORDER — SUMATRIPTAN SUCCINATE 50 MG PO TABS: 50.0000 mg | ORAL_TABLET | ORAL | 5 refills | Status: AC | PRN

## 2021-12-23 NOTE — Patient Instructions (Addendum)
The fluttering is an inner ear issue, the muscles controlling the ear drum spasms.  If it continues to be an issue, I would follow up with ENT  Try sumatriptan as directed for migraine attack.  Also may try Nurtec samples (can take 1 in 24 hours and take at onset of the vertigo, not just the headache)  Continue gabapentin  Follow up as scheduled in June

## 2021-12-24 DIAGNOSIS — E538 Deficiency of other specified B group vitamins: Secondary | ICD-10-CM | POA: Diagnosis not present

## 2021-12-25 DIAGNOSIS — H9312 Tinnitus, left ear: Secondary | ICD-10-CM | POA: Diagnosis not present

## 2021-12-31 DIAGNOSIS — F4322 Adjustment disorder with anxiety: Secondary | ICD-10-CM | POA: Diagnosis not present

## 2022-01-06 DIAGNOSIS — J4550 Severe persistent asthma, uncomplicated: Secondary | ICD-10-CM | POA: Diagnosis not present

## 2022-01-11 DIAGNOSIS — Z01419 Encounter for gynecological examination (general) (routine) without abnormal findings: Secondary | ICD-10-CM | POA: Diagnosis not present

## 2022-01-11 DIAGNOSIS — Z1231 Encounter for screening mammogram for malignant neoplasm of breast: Secondary | ICD-10-CM | POA: Diagnosis not present

## 2022-01-11 DIAGNOSIS — Z6829 Body mass index (BMI) 29.0-29.9, adult: Secondary | ICD-10-CM | POA: Diagnosis not present

## 2022-01-18 DIAGNOSIS — J4540 Moderate persistent asthma, uncomplicated: Secondary | ICD-10-CM | POA: Diagnosis not present

## 2022-01-18 DIAGNOSIS — F4322 Adjustment disorder with anxiety: Secondary | ICD-10-CM | POA: Diagnosis not present

## 2022-01-18 DIAGNOSIS — J4550 Severe persistent asthma, uncomplicated: Secondary | ICD-10-CM | POA: Diagnosis not present

## 2022-02-03 DIAGNOSIS — J45998 Other asthma: Secondary | ICD-10-CM | POA: Diagnosis not present

## 2022-02-03 DIAGNOSIS — B349 Viral infection, unspecified: Secondary | ICD-10-CM | POA: Diagnosis not present

## 2022-02-06 DIAGNOSIS — J40 Bronchitis, not specified as acute or chronic: Secondary | ICD-10-CM | POA: Diagnosis not present

## 2022-02-06 DIAGNOSIS — J4521 Mild intermittent asthma with (acute) exacerbation: Secondary | ICD-10-CM | POA: Diagnosis not present

## 2022-02-16 DIAGNOSIS — J4550 Severe persistent asthma, uncomplicated: Secondary | ICD-10-CM | POA: Diagnosis not present

## 2022-02-16 DIAGNOSIS — J4540 Moderate persistent asthma, uncomplicated: Secondary | ICD-10-CM | POA: Diagnosis not present

## 2022-03-16 DIAGNOSIS — J4540 Moderate persistent asthma, uncomplicated: Secondary | ICD-10-CM | POA: Diagnosis not present

## 2022-03-16 DIAGNOSIS — J4550 Severe persistent asthma, uncomplicated: Secondary | ICD-10-CM | POA: Diagnosis not present

## 2022-03-23 DIAGNOSIS — F4322 Adjustment disorder with anxiety: Secondary | ICD-10-CM | POA: Diagnosis not present

## 2022-04-23 DIAGNOSIS — H509 Unspecified strabismus: Secondary | ICD-10-CM | POA: Diagnosis not present

## 2022-04-23 DIAGNOSIS — H52203 Unspecified astigmatism, bilateral: Secondary | ICD-10-CM | POA: Diagnosis not present

## 2022-04-28 DIAGNOSIS — E538 Deficiency of other specified B group vitamins: Secondary | ICD-10-CM | POA: Diagnosis not present

## 2022-04-28 DIAGNOSIS — F419 Anxiety disorder, unspecified: Secondary | ICD-10-CM | POA: Diagnosis not present

## 2022-04-28 DIAGNOSIS — R030 Elevated blood-pressure reading, without diagnosis of hypertension: Secondary | ICD-10-CM | POA: Diagnosis not present

## 2022-04-28 DIAGNOSIS — J4540 Moderate persistent asthma, uncomplicated: Secondary | ICD-10-CM | POA: Diagnosis not present

## 2022-04-28 DIAGNOSIS — G43109 Migraine with aura, not intractable, without status migrainosus: Secondary | ICD-10-CM | POA: Diagnosis not present

## 2022-04-28 DIAGNOSIS — E063 Autoimmune thyroiditis: Secondary | ICD-10-CM | POA: Diagnosis not present

## 2022-06-14 DIAGNOSIS — R059 Cough, unspecified: Secondary | ICD-10-CM | POA: Diagnosis not present

## 2022-06-14 DIAGNOSIS — J988 Other specified respiratory disorders: Secondary | ICD-10-CM | POA: Diagnosis not present

## 2022-07-16 DIAGNOSIS — L820 Inflamed seborrheic keratosis: Secondary | ICD-10-CM | POA: Diagnosis not present

## 2022-07-16 DIAGNOSIS — R58 Hemorrhage, not elsewhere classified: Secondary | ICD-10-CM | POA: Diagnosis not present

## 2022-08-05 ENCOUNTER — Ambulatory Visit: Payer: BC Managed Care – PPO | Admitting: Neurology

## 2022-11-29 ENCOUNTER — Ambulatory Visit: Payer: BC Managed Care – PPO | Admitting: Internal Medicine

## 2023-02-24 ENCOUNTER — Other Ambulatory Visit: Payer: Self-pay | Admitting: Internal Medicine
